# Patient Record
Sex: Male | Born: 1960 | Race: White | Hispanic: No | Marital: Married | Smoking: Current every day smoker
Health system: Southern US, Community
[De-identification: ages and names within clinical notes are randomized; demographics above are authoritative.]

## PROBLEM LIST (undated history)

## (undated) DIAGNOSIS — G43909 Migraine, unspecified, not intractable, without status migrainosus: Secondary | ICD-10-CM

## (undated) DIAGNOSIS — N200 Calculus of kidney: Secondary | ICD-10-CM

---

## 2015-01-22 DIAGNOSIS — N132 Hydronephrosis with renal and ureteral calculous obstruction: Secondary | ICD-10-CM | POA: Diagnosis present

## 2017-11-24 ENCOUNTER — Encounter: Payer: Self-pay | Admitting: Emergency Medicine

## 2017-11-24 ENCOUNTER — Emergency Department: Payer: PRIVATE HEALTH INSURANCE

## 2017-11-24 ENCOUNTER — Emergency Department
Admission: EM | Admit: 2017-11-24 | Discharge: 2017-11-24 | Disposition: A | Discharge status: Home / Self Care | Payer: PRIVATE HEALTH INSURANCE | Attending: Emergency Medicine | Admitting: Emergency Medicine

## 2017-11-24 DIAGNOSIS — R1084 Generalized abdominal pain: Secondary | ICD-10-CM | POA: Diagnosis present

## 2017-11-24 DIAGNOSIS — N200 Calculus of kidney: Secondary | ICD-10-CM | POA: Diagnosis not present

## 2017-11-24 DIAGNOSIS — N179 Acute kidney failure, unspecified: Secondary | ICD-10-CM | POA: Diagnosis not present

## 2017-11-24 HISTORY — DX: Calculus of kidney: N20.0

## 2017-11-24 LAB — URINALYSIS, COMPLETE (UACMP) WITH MICROSCOPIC
Bacteria, UA: NONE SEEN
Bilirubin Urine: NEGATIVE
Glucose, UA: NEGATIVE mg/dL
Ketones, ur: NEGATIVE mg/dL
Leukocytes, UA: NEGATIVE
Nitrite: NEGATIVE
Protein, ur: NEGATIVE mg/dL
Specific Gravity, Urine: >1.03 — ABNORMAL HIGH (ref 1.005–1.030)
pH: 5.5 (ref 5.0–8.0)

## 2017-11-24 LAB — BASIC METABOLIC PANEL
Anion gap: 6 (ref 5–15)
BUN: 26 mg/dL — ABNORMAL HIGH (ref 6–20)
CO2: 28 mmol/L (ref 22–32)
Calcium: 9.1 mg/dL (ref 8.9–10.3)
Chloride: 106 mmol/L (ref 98–111)
Creatinine, Ser: 2.34 mg/dL — ABNORMAL HIGH (ref 0.61–1.24)
GFR calc Af Amer: 35 mL/min — ABNORMAL LOW (ref >60)
GFR calc non Af Amer: 30 mL/min — ABNORMAL LOW (ref >60)
Glucose, Bld: 124 mg/dL — ABNORMAL HIGH (ref 70–99)
Potassium: 4.2 mmol/L (ref 3.5–5.1)
Sodium: 140 mmol/L (ref 135–145)

## 2017-11-24 LAB — CBC
HCT: 42.9 % (ref 40.0–52.0)
Hemoglobin: 14.6 g/dL (ref 13.0–18.0)
MCH: 31.7 pg (ref 26.0–34.0)
MCHC: 34 g/dL (ref 32.0–36.0)
MCV: 93.5 fL (ref 80.0–100.0)
Platelets: 276 10*3/uL (ref 150–440)
RBC: 4.59 MIL/uL (ref 4.40–5.90)
RDW: 13.6 % (ref 11.5–14.5)
WBC: 12.3 10*3/uL — ABNORMAL HIGH (ref 3.8–10.6)

## 2017-11-24 MED ORDER — HYDROMORPHONE HCL 1 MG/ML IJ SOLN
1.0000 mg | Freq: Once | INTRAMUSCULAR | Status: AC
  Administered 2017-11-24: 1 mg via INTRAVENOUS
  Filled 2017-11-24: qty 1

## 2017-11-24 MED ORDER — TAMSULOSIN HCL 0.4 MG PO CAPS: 0.4000 mg | ORAL_CAPSULE | Freq: Every day | ORAL | 0 refills | Status: DC

## 2017-11-24 MED ORDER — SODIUM CHLORIDE 0.9 % IV BOLUS
1000.0000 mL | Freq: Once | INTRAVENOUS | Status: AC
  Administered 2017-11-24: 1000 mL via INTRAVENOUS

## 2017-11-24 MED ORDER — ONDANSETRON HCL 4 MG PO TABS: 4.0000 mg | ORAL_TABLET | Freq: Every day | ORAL | 0 refills | Status: DC | PRN

## 2017-11-24 MED ORDER — OXYCODONE-ACETAMINOPHEN 5-325 MG PO TABS: 1.0000 | ORAL_TABLET | ORAL | 0 refills | Status: AC | PRN

## 2017-11-24 MED ORDER — ONDANSETRON HCL 4 MG/2ML IJ SOLN
4.0000 mg | Freq: Once | INTRAMUSCULAR | Status: AC
  Administered 2017-11-24: 4 mg via INTRAVENOUS
  Filled 2017-11-24: qty 2

## 2017-11-24 NOTE — Consult Note (Signed)
   Urology Consult  I have been asked to see the patient by Dr. Schaevitz, for evaluation and management of left ureteral stone.  Chief Complaint: Left flank pain  History of Present Illness: Sean Rice is a 56 y.o. year old from [place] with a known 5 mm left UVJ stone diagnosed 2 days ago in [place].  He presented to our emergency room after traveling to [place] yesterday.    He has persistent left flank pain which is worsened.  He was told that he would pass a stone pass spontaneously given the size and location.  He was prescribed Flomax and pain meds.  He has received Dilaudid in the emergency room is now quite comfortable.  No nausea or vomiting.  No fevers or chills.  No significant urinary symptoms.  Notably today, his creatinine is up somewhat from his baseline of around 1.6.  He is on multiple previous stone episodes in the past requiring ureteroscopy and stents.  He is never had shockwave lithotripsy before but it may be interested in this.  He lives in [place] but works here in [place] on occasion.  He is in the area for business and will be for about 8 days.  KUB in the emergency room shows multiple phleboliths and unable to differentiate which is the true stone.  Thus CT scan was ordered which shows persistent 5 mm left UVJ stone with mild hydroureteronephrosis.  Past Medical History:  Diagnosis Date  . Kidney stones     Surgical history  multiple previous endoscopic stone procedures Spine surgery (cervical spine)  Home Medications:  sumatriptan (IMITREX) 25 MG TABS Take 25 mg by mouth every 2 (two) hours as needed. oxycodone-acetaminophen (PERCOCET) 5-325 MG TABS Take 1 tablet by mouth every 4 (four) hours as needed for Moderate Pain (4-6).  Flomax 0.4 mg daily  Allergies: No known drug allergies  Family history: Not pertinent  Social History:  has no tobacco, alcohol, and drug history on file.  ROS: A complete review of systems was performed.  All  systems are negative except for pertinent findings as noted.  Physical Exam:  Vital signs in last 24 hours: Temp:  [98.6 F (37 C)] 98.6 F (37 C) (07/17 1238) Pulse Rate:  [64-82] 65 (07/17 1632) Resp:  [20] 20 (07/17 1238) BP: (111-132)/(69-83) 111/79 (07/17 1632) SpO2:  [92 %-97 %] 92 % (07/17 1632) Weight:  [290 lb (131.5 kg)] 290 lb (131.5 kg) (07/17 1239) Constitutional:  Alert and oriented, No acute distress HEENT: [place] AT, moist mucus membranes.  Trachea midline, no masses Cardiovascular: Regular rate and rhythm, no clubbing, cyanosis, or edema. Respiratory: Normal respiratory effort, lungs clear bilaterally GI: Abdomen is soft, minimal left lower quadrant pain GU: No CVA tenderness bilaterally Skin: No rashes, bruises or suspicious lesions Neurologic: Grossly intact, no focal deficits, moving all 4 extremities Psychiatric: Normal mood and affect   Laboratory Data:  Recent Labs    11/24/17 1240  WBC 12.3*  HGB 14.6  HCT 42.9   Recent Labs    11/24/17 1240  NA 140  K 4.2  CL 106  CO2 28  GLUCOSE 124*  BUN 26*  CREATININE 2.34*  CALCIUM 9.1   Component     Latest Ref Rng & Units 11/24/2017  Color, Urine     YELLOW YELLOW  Appearance     CLEAR CLEAR  Specific Gravity, Urine     1.005 - 1.030 >1.030 (H)  pH     5.0 - 8.0   5.5  Glucose     NEGATIVE mg/dL NEGATIVE  Hgb urine dipstick     NEGATIVE SMALL (A)  Bilirubin Urine     NEGATIVE NEGATIVE  Ketones, ur     NEGATIVE mg/dL NEGATIVE  Protein     NEGATIVE mg/dL NEGATIVE  Nitrite     NEGATIVE NEGATIVE  Leukocytes, UA     NEGATIVE NEGATIVE  Squamous Epithelial / LPF     0 - 5 0-5  WBC, UA     0 - 5 WBC/hpf 0-5  RBC / HPF     0 - 5 RBC/hpf 0-5  Bacteria, UA     NONE SEEN NONE SEEN    Radiologic Imaging: CLINICAL DATA:  Left flank pain for 2 days.  EXAM: CT ABDOMEN AND PELVIS WITHOUT CONTRAST  TECHNIQUE: Multidetector CT imaging of the abdomen and pelvis was performed following the  standard protocol without IV contrast.  COMPARISON:  Radiograph 11/24/2017  FINDINGS: Lower chest: The lung bases are clear of acute process. No pleural effusion or pulmonary lesions. The heart is normal in size. No pericardial effusion. The distal esophagus and aorta are unremarkable.  Hepatobiliary: No focal hepatic lesions or intrahepatic biliary dilatation. The gallbladder is normal. No common bile duct dilatation.  Pancreas: No mass, inflammation or ductal dilatation.  Spleen: Normal size.  No focal lesions.  Adrenals/Urinary Tract: The adrenal glands are normal.  Right kidney:  No renal or ureteral calculi. No worrisome renal lesions without contrast.  Left kidney:  Moderate left-sided hydroureteronephrosis down to an obstructing 5 x 4 mm calculus at the left UVJ. No worrisome renal lesions.  The bladder is unremarkable.  Stomach/Bowel: The stomach, duodenum, small bowel and colon are grossly normal without oral contrast. No inflammatory changes, mass lesions or obstructive findings. The terminal ileum and appendix are normal.  Vascular/Lymphatic: The aorta is normal in caliber. No dissection. The branch vessels are patent. The major venous structures are patent. No mesenteric or retroperitoneal mass or adenopathy. Small scattered lymph nodes are noted.  Reproductive: The prostate gland and seminal vesicles are unremarkable.  Other: No pelvic mass or adenopathy. No free pelvic fluid collections. No inguinal mass or adenopathy. No abdominal wall hernia or subcutaneous lesions.  Musculoskeletal: No significant bony findings.  IMPRESSION: 5 x 4 mm left UVJ calculus causing moderate grade obstruction with moderate left-sided hydroureteronephrosis.  Small lower pole left renal calculus.  No worrisome renal or bladder lesions.  No other significant intra-abdominal/pelvic findings.   Electronically Signed   By: P.  Gallerani M.D.    On: 11/24/2017 16:40  CLINICAL DATA:  Pt reports dx'd with a 5mm stone to the left side a few days ago. Pt states that he was told to come to the ED in 2 days if it had not passed. Pt reports still with pain to his left flank.  EXAM: ABDOMEN - 1 VIEW  COMPARISON:  None.  FINDINGS: Multiple calcifications along the anatomic pelvis, most of which are likely vascular. Notable bowel projects over both renal shadows. I do not see a definite stone separate from the bowel shadows, but sensitivity is low given the complexity of the bowel shadows projecting over the kidneys.  Linear calcification just to the left of the sacrum measures 14 mm in length and is probably vascular, but could be a distal ureteral calculus.  IMPRESSION: 1. Calcifications in the pelvis bilaterally are probably vascular. Some of these in the upper pelvis could potentially be ureteral calculi on both sides. CT would   be ideal for differentiation.   Electronically Signed   By: Walter  Liebkemann M.D.   On: 11/24/2017 16:18    Impression/ Plan: 56-year-old male with history of recurrent kidney stones with a 5 mm left UVJ stone without signs or symptoms of infection, however, his pain is been somewhat poorly controlled.  He is interested in proceeding with intervention due to this.  He is not currently n.p.o.   We repeated the CT scan here as KUB was not definitive for retained stone.  This does indeed confirm that the stone is still present.  With comparison to KUB, the stone is likely obscured by the sacrum thus not visible.  Each of these studies were personally reviewed.    We discussed possible treatment options which would include ureteroscopy with laser lithotripsy and stent versus shockwave lithotripsy.  We discussed that shockwave lithotripsy is less effective, especially with distal ureteral stones and given that the stone is not visible, we may not be able to see it down on the truck thus may not  be able to proceed with this.  We discussed the risk and benefits of each as well as the efficacy of each.    He is adamant about not wanting a ureteral stent again and would like to try shockwave lithotripsy.  He understands all the above.  We will plan to bring him out to the truck and perform some oblique images to see if we can do stone.  If not, he will we will have this procedure and will need ureteroscopy.  He also has acute on chronic renal insufficiency secondary to ureteral obstruction.  No hyperkalemia.  Was also likely exacerbated due to dehydration.  At this point time, his pain is controlled and would like to go home tonight.  Strict return precautions were reviewed.  We will proceed as above tomorrow and if shockwave is not possible, will hopefully able to do ureteroscopy.  All questions answered.   11/24/2017, 4:43 PM  Sean Luffman,  MD   Case was discussed with Dr. Schaevitz who is agreeable this plan.     

## 2017-11-24 NOTE — ED Triage Notes (Signed)
Pt reports dx'd with a 5mm stone to the left side a few days ago. Pt states that he was told to come to the ED in 2 days if it had not passed. Pt reports still with pain to his left flank.

## 2017-11-24 NOTE — H&P (View-Only) (Signed)
Urology Consult  I have been asked to see the patient by Dr. Pershing ProudSchaevitz, for evaluation and management of left ureteral stone.  Chief Complaint: Left flank pain  History of Present Illness: Sean Rice is a 57 y.o. year old from [place] with a known 5 mm left UVJ stone diagnosed 2 days ago in [place].  He presented to our emergency room [place].    He has persistent left flank pain which is worsened.  He was told that he would pass a stone pass spontaneously given the size and location.  He was prescribed Flomax and pain meds.  He has received Dilaudid in the emergency room is now quite comfortable.  No nausea or vomiting.  No fevers or chills.  No significant urinary symptoms.  Notably today, his creatinine is up somewhat from his baseline of around 1.6.  He is on multiple previous stone episodes in the past requiring ureteroscopy and stents.  He is never had shockwave lithotripsy before but it may be interested in this.  [place] lives in [place].  He is in the area for business and will be for about 8 days.  KUB in the emergency room shows multiple phleboliths and unable to differentiate which is the true stone.  Thus CT scan was ordered which shows persistent 5 mm left UVJ stone with mild hydroureteronephrosis.  Past Medical History:  Diagnosis Date  . Kidney stones     Surgical history  multiple previous endoscopic stone procedures Spine surgery (cervical spine)  Home Medications:  sumatriptan (IMITREX) 25 MG TABS Take 25 mg by mouth every 2 (two) hours as needed. oxycodone-acetaminophen (PERCOCET) 5-325 MG TABS Take 1 tablet by mouth every 4 (four) hours as needed for Moderate Pain (4-6).  Flomax 0.4 mg daily  Allergies: No known drug allergies  Family history: Not pertinent  Social History:  has no tobacco, alcohol, and drug history on file.  ROS: A complete review of systems was performed.  All  systems are negative except for pertinent findings as noted.  Physical Exam:  Vital signs in last 24 hours: Temp:  [98.6 F (37 C)] 98.6 F (37 C) (07/17 1238) Pulse Rate:  [64-82] 65 (07/17 1632) Resp:  [20] 20 (07/17 1238) BP: (111-132)/(69-83) 111/79 (07/17 1632) SpO2:  [92 %-97 %] 92 % (07/17 1632) Weight:  [290 lb (131.5 kg)] 290 lb (131.5 kg) (07/17 1239) Constitutional:  Alert and oriented, No acute distress HEENT: [place] AT, moist mucus membranes.  Trachea midline, no masses Cardiovascular: Regular rate and rhythm, no clubbing, cyanosis, or edema. Respiratory: Normal respiratory effort, lungs clear bilaterally GI: Abdomen is soft, minimal left lower quadrant pain GU: No CVA tenderness bilaterally Skin: No rashes, bruises or suspicious lesions Neurologic: Grossly intact, no focal deficits, moving all 4 extremities Psychiatric: Normal mood and affect   Laboratory Data:  Recent Labs    11/24/17 1240  WBC 12.3*  HGB 14.6  HCT 42.9   Recent Labs    11/24/17 1240  NA 140  K 4.2  CL 106  CO2 28  GLUCOSE 124*  BUN 26*  CREATININE 2.34*  CALCIUM 9.1   Component     Latest Ref Rng & Units 11/24/2017  Color, Urine     YELLOW YELLOW  Appearance     CLEAR CLEAR  Specific Gravity, Urine     1.005 - 1.030 >1.030 (H)  pH     5.0 - 8.0  5.5  Glucose     NEGATIVE mg/dL NEGATIVE  Hgb urine dipstick     NEGATIVE SMALL (A)  Bilirubin Urine     NEGATIVE NEGATIVE  Ketones, ur     NEGATIVE mg/dL NEGATIVE  Protein     NEGATIVE mg/dL NEGATIVE  Nitrite     NEGATIVE NEGATIVE  Leukocytes, UA     NEGATIVE NEGATIVE  Squamous Epithelial / LPF     0 - 5 0-5  WBC, UA     0 - 5 WBC/hpf 0-5  RBC / HPF     0 - 5 RBC/hpf 0-5  Bacteria, UA     NONE SEEN NONE SEEN    Radiologic Imaging: CLINICAL DATA:  Left flank pain for 2 days.  EXAM: CT ABDOMEN AND PELVIS WITHOUT CONTRAST  TECHNIQUE: Multidetector CT imaging of the abdomen and pelvis was performed following the  standard protocol without IV contrast.  COMPARISON:  Radiograph 11/24/2017  FINDINGS: Lower chest: The lung bases are clear of acute process. No pleural effusion or pulmonary lesions. The heart is normal in size. No pericardial effusion. The distal esophagus and aorta are unremarkable.  Hepatobiliary: No focal hepatic lesions or intrahepatic biliary dilatation. The gallbladder is normal. No common bile duct dilatation.  Pancreas: No mass, inflammation or ductal dilatation.  Spleen: Normal size.  No focal lesions.  Adrenals/Urinary Tract: The adrenal glands are normal.  Right kidney:  No renal or ureteral calculi. No worrisome renal lesions without contrast.  Left kidney:  Moderate left-sided hydroureteronephrosis down to an obstructing 5 x 4 mm calculus at the left UVJ. No worrisome renal lesions.  The bladder is unremarkable.  Stomach/Bowel: The stomach, duodenum, small bowel and colon are grossly normal without oral contrast. No inflammatory changes, mass lesions or obstructive findings. The terminal ileum and appendix are normal.  Vascular/Lymphatic: The aorta is normal in caliber. No dissection. The branch vessels are patent. The major venous structures are patent. No mesenteric or retroperitoneal mass or adenopathy. Small scattered lymph nodes are noted.  Reproductive: The prostate gland and seminal vesicles are unremarkable.  Other: No pelvic mass or adenopathy. No free pelvic fluid collections. No inguinal mass or adenopathy. No abdominal wall hernia or subcutaneous lesions.  Musculoskeletal: No significant bony findings.  IMPRESSION: 5 x 4 mm left UVJ calculus causing moderate grade obstruction with moderate left-sided hydroureteronephrosis.  Small lower pole left renal calculus.  No worrisome renal or bladder lesions.  No other significant intra-abdominal/pelvic findings.   Electronically Signed   By: Rudie Meyer M.D.    On: 11/24/2017 16:40  CLINICAL DATA:  Pt reports dx'd with a 5mm stone to the left side a few days ago. Pt states that he was told to come to the ED in 2 days if it had not passed. Pt reports still with pain to his left flank.  EXAM: ABDOMEN - 1 VIEW  COMPARISON:  None.  FINDINGS: Multiple calcifications along the anatomic pelvis, most of which are likely vascular. Notable bowel projects over both renal shadows. I do not see a definite stone separate from the bowel shadows, but sensitivity is low given the complexity of the bowel shadows projecting over the kidneys.  Linear calcification just to the left of the sacrum measures 14 mm in length and is probably vascular, but could be a distal ureteral calculus.  IMPRESSION: 1. Calcifications in the pelvis bilaterally are probably vascular. Some of these in the upper pelvis could potentially be ureteral calculi on both sides. CT would  be ideal for differentiation.   Electronically Signed   By: Gaylyn Rong M.D.   On: 11/24/2017 16:18    Impression/ Plan: 57 year old male with history of recurrent kidney stones with a 5 mm left UVJ stone without signs or symptoms of infection, however, his pain is been somewhat poorly controlled.  He is interested in proceeding with intervention due to this.  He is not currently n.p.o.   We repeated the CT scan here as KUB was not definitive for retained stone.  This does indeed confirm that the stone is still present.  With comparison to KUB, the stone is likely obscured by the sacrum thus not visible.  Each of these studies were personally reviewed.    We discussed possible treatment options which would include ureteroscopy with laser lithotripsy and stent versus shockwave lithotripsy.  We discussed that shockwave lithotripsy is less effective, especially with distal ureteral stones and given that the stone is not visible, we may not be able to see it down on the truck thus may not  be able to proceed with this.  We discussed the risk and benefits of each as well as the efficacy of each.    He is adamant about not wanting a ureteral stent again and would like to try shockwave lithotripsy.  He understands all the above.  We will plan to bring him out to the truck and perform some oblique images to see if we can do stone.  If not, he will we will have this procedure and will need ureteroscopy.  He also has acute on chronic renal insufficiency secondary to ureteral obstruction.  No hyperkalemia.  Was also likely exacerbated due to dehydration.  At this point time, his pain is controlled and would like to go home tonight.  Strict return precautions were reviewed.  We will proceed as above tomorrow and if shockwave is not possible, will hopefully able to do ureteroscopy.  All questions answered.   11/24/2017, 4:43 PM  Vanna Scotland,  MD   Case was discussed with Dr. Pershing Proud who is agreeable this plan.

## 2017-11-24 NOTE — ED Provider Notes (Signed)
[facility] Emergency Department Provider Note ____________________________________________   First MD Initiated Contact with Patient 11/24/17 1459     (approximate)  I have reviewed the triage vital signs and the nursing notes.   HISTORY  Chief Complaint Flank Pain  HPI Sean Rice is a 57 y.o. male  history of kidney stones requiring "11 stents" who is presenting to the emergency department left-sided flank pain.  He says that he was diagnosed with a 5 mm UVJ stone on 15 July in [place] and discharged with Percocet.  However, he says that he has been using the Percocet as prescribed and the pain is been uncontrollable since midnight last night.  No fever, nausea or vomiting.  He states the pain is a 10 out of 10 right now and feels like a sharp stabbing pain to his left-sided lumbar back region.  Says that he is not having any issues urinating.  Past Medical History:  Diagnosis Date  . Kidney stones     There are no active problems to display for this patient.   History reviewed. No pertinent surgical history.  Prior to Admission medications   Not on File    Allergies Patient has no allergy information on record.  No family history on file.  Social History Social History   Tobacco Use  . Smoking status: Not on file  Substance Use Topics  . Alcohol use: Not on file  . Drug use: Not on file    Review of Systems  Constitutional: No fever/chills Eyes: No visual changes. ENT: No sore throat. Cardiovascular: Denies chest pain. Respiratory: Denies shortness of breath. Gastrointestinal: No abdominal pain.  No nausea, no vomiting.  No diarrhea.  No constipation. Genitourinary: Negative for dysuria. Musculoskeletal: As above  skin: Negative for rash. Neurological: Negative for headaches, focal weakness or numbness.   ____________________________________________   PHYSICAL EXAM:  VITAL SIGNS: ED Triage Vitals  Enc Vitals Group     BP  11/24/17 1238 132/71     Pulse Rate 11/24/17 1238 82     Resp 11/24/17 1238 20     Temp 11/24/17 1238 98.6 F (37 C)     Temp Source 11/24/17 1238 Oral     SpO2 11/24/17 1238 95 %     Weight 11/24/17 1226 290 lb (131.5 kg)     Height 11/24/17 1226 6\' 2"  (1.88 m)     Head Circumference --      Peak Flow --      Pain Score 11/24/17 1226 10     Pain Loc --      Pain Edu? --      Excl. in GC? --     Constitutional: Alert and oriented.  Appears uncomfortable.  Pacing around the room. Eyes: Conjunctivae are normal.  Head: Atraumatic. Nose: No congestion/rhinnorhea. Mouth/Throat: Mucous membranes are moist.  Neck: No stridor.   Cardiovascular: Normal rate, regular rhythm. Grossly normal heart sounds.   Respiratory: Normal respiratory effort.  No retractions. Lungs CTAB. Gastrointestinal: Soft and nontender. No distention.  Mild left-sided CVA tenderness to palpation. Musculoskeletal: No lower extremity tenderness nor edema.  No joint effusions. Neurologic:  Normal speech and language. No gross focal neurologic deficits are appreciated. Skin:  Skin is warm, dry and intact. No rash noted. Psychiatric: Mood and affect are normal. Speech and behavior are normal.  ____________________________________________   LABS (all labs ordered are listed, but only abnormal results are displayed)  Labs Reviewed  URINALYSIS, COMPLETE (UACMP) WITH MICROSCOPIC -  Abnormal; Notable for the following components:      Result Value   Specific Gravity, Urine >1.030 (*)    Hgb urine dipstick SMALL (*)    All other components within normal limits  BASIC METABOLIC PANEL - Abnormal; Notable for the following components:   Glucose, Bld 124 (*)    BUN 26 (*)    Creatinine, Ser 2.34 (*)    GFR calc non Af Amer 30 (*)    GFR calc Af Amer 35 (*)    All other components within normal limits  CBC - Abnormal; Notable for the following components:   WBC 12.3 (*)    All other components within normal limits    ____________________________________________  EKG   ____________________________________________  RADIOLOGY  Reviewed imaging from 715 from care everywhere which shows that the patient did indeed have a 5 mm left-sided UVJ stone. ____________________________________________   PROCEDURES  Procedure(s) performed:   Procedures  Critical Care performed:   ____________________________________________   INITIAL IMPRESSION / ASSESSMENT AND PLAN / ED COURSE  Pertinent labs & imaging results that were available during my care of the patient were reviewed by me and considered in my medical decision making (see chart for details).  Differential diagnosis includes, but is not limited to, acute appendicitis, renal colic, testicular torsion, urinary tract infection/pyelonephritis, prostatitis,  epididymitis, diverticulitis, small bowel obstruction or ileus, colitis, abdominal aortic aneurysm, gastroenteritis, hernia, etc. As part of my medical decision making, I reviewed the following data within the electronic MEDICAL RECORD NUMBER Notes from prior ED visits  ----------------------------------------- 3:41 PM on 11/24/2017 -----------------------------------------  Pain controlled with Dilaudid and is now a 4 out of 10.  Patient is lying in the bed and appears comfortable.  Discussed case with Dr. Apolinar Junes of urology who is going to discuss further treatment plan for the patient as the patient says that he is only in town for 8 days.  KUB ordered by Dr. Apolinar Junes.  ----------------------------------------- 5:04 PM on 11/24/2017 -----------------------------------------  Patient at this time is pain-free.  Evaluated by Dr. Apolinar Junes who will be seeing the patient tomorrow morning 815 in the medical mall, same-day surgery, for likely lithotripsy.  Patient out of Percocet.  Will be discharged with Percocet and Flomax.  He is understanding the plan for follow-up tomorrow and willing to comply.   Repeat CT ordered with repeat demonstration of kidney stone with hydroureteronephrosis.  Dr. Apolinar Junes aware of the renal failure and has established this plan for the overall treatment of the patient's medical issues at this time.  ____________________________________________   FINAL CLINICAL IMPRESSION(S) / ED DIAGNOSES  Acute kidney injury.  Kidney stone.    NEW MEDICATIONS STARTED DURING THIS VISIT:  New Prescriptions   No medications on file     Note:  This document was prepared using Dragon voice recognition software and may include unintentional dictation errors.     Myrna Blazer, MD 11/24/17 [id number]

## 2017-11-24 NOTE — ED Notes (Signed)
ED Provider at bedside. 

## 2017-11-24 NOTE — ED Notes (Signed)
Patient transported to CT 

## 2017-11-25 ENCOUNTER — Encounter
Admission: RE | Disposition: A | Discharge status: Home / Self Care | Payer: Self-pay | Source: Ambulatory Visit | Attending: Urology

## 2017-11-25 ENCOUNTER — Ambulatory Visit: Payer: PRIVATE HEALTH INSURANCE | Admitting: Anesthesiology

## 2017-11-25 ENCOUNTER — Other Ambulatory Visit: Payer: Self-pay

## 2017-11-25 ENCOUNTER — Ambulatory Visit
Admission: RE | Admit: 2017-11-25 | Discharge: 2017-11-25 | Disposition: A | Discharge status: Home / Self Care | Payer: PRIVATE HEALTH INSURANCE | Source: Ambulatory Visit | Attending: Urology | Admitting: Urology

## 2017-11-25 ENCOUNTER — Ambulatory Visit: Admit: 2017-11-25 | Payer: PRIVATE HEALTH INSURANCE | Admitting: Urology

## 2017-11-25 ENCOUNTER — Other Ambulatory Visit: Payer: Self-pay | Admitting: Radiology

## 2017-11-25 DIAGNOSIS — Z79899 Other long term (current) drug therapy: Secondary | ICD-10-CM | POA: Diagnosis not present

## 2017-11-25 DIAGNOSIS — N201 Calculus of ureter: Secondary | ICD-10-CM

## 2017-11-25 DIAGNOSIS — N132 Hydronephrosis with renal and ureteral calculous obstruction: Secondary | ICD-10-CM | POA: Diagnosis not present

## 2017-11-25 DIAGNOSIS — F17200 Nicotine dependence, unspecified, uncomplicated: Secondary | ICD-10-CM | POA: Insufficient documentation

## 2017-11-25 DIAGNOSIS — N189 Chronic kidney disease, unspecified: Secondary | ICD-10-CM | POA: Insufficient documentation

## 2017-11-25 DIAGNOSIS — Z87442 Personal history of urinary calculi: Secondary | ICD-10-CM | POA: Diagnosis not present

## 2017-11-25 HISTORY — PX: EXTRACORPOREAL SHOCK WAVE LITHOTRIPSY: SHX1557

## 2017-11-25 HISTORY — PX: CYSTOSCOPY/URETEROSCOPY/HOLMIUM LASER/STENT PLACEMENT: SHX6546

## 2017-11-25 SURGERY — LITHOTRIPSY, ESWL
Anesthesia: Moderate Sedation | Laterality: Left

## 2017-11-25 SURGERY — CYSTOSCOPY/URETEROSCOPY/HOLMIUM LASER/STENT PLACEMENT
Anesthesia: General | Laterality: Left

## 2017-11-25 MED ORDER — ROCURONIUM BROMIDE 50 MG/5ML IV SOLN
INTRAVENOUS | Status: AC
  Filled 2017-11-25: qty 1

## 2017-11-25 MED ORDER — CIPROFLOXACIN HCL 500 MG PO TABS
500.0000 mg | ORAL_TABLET | ORAL | Status: AC
  Administered 2017-11-25: 500 mg via ORAL

## 2017-11-25 MED ORDER — PROMETHAZINE HCL 25 MG/ML IJ SOLN: 6.2500 mg | INTRAMUSCULAR | Status: DC | PRN

## 2017-11-25 MED ORDER — ONDANSETRON HCL 4 MG/2ML IJ SOLN
INTRAMUSCULAR | Status: AC
  Filled 2017-11-25: qty 2

## 2017-11-25 MED ORDER — DIAZEPAM 5 MG PO TABS
ORAL_TABLET | ORAL | Status: AC
  Filled 2017-11-25: qty 2

## 2017-11-25 MED ORDER — HYDROCODONE-ACETAMINOPHEN 7.5-325 MG PO TABS
1.0000 | ORAL_TABLET | Freq: Once | ORAL | Status: DC | PRN
Start: 2017-11-25 — End: 2017-11-25

## 2017-11-25 MED ORDER — PROPOFOL 10 MG/ML IV BOLUS
INTRAVENOUS | Status: DC | PRN
  Administered 2017-11-25: 200 mg via INTRAVENOUS

## 2017-11-25 MED ORDER — SUCCINYLCHOLINE CHLORIDE 20 MG/ML IJ SOLN
INTRAMUSCULAR | Status: DC | PRN
  Administered 2017-11-25: 120 mg via INTRAVENOUS

## 2017-11-25 MED ORDER — DEXAMETHASONE SODIUM PHOSPHATE 10 MG/ML IJ SOLN
INTRAMUSCULAR | Status: DC | PRN
  Administered 2017-11-25: 10 mg via INTRAVENOUS

## 2017-11-25 MED ORDER — FENTANYL CITRATE (PF) 100 MCG/2ML IJ SOLN
INTRAMUSCULAR | Status: DC | PRN
  Administered 2017-11-25: 50 ug via INTRAVENOUS

## 2017-11-25 MED ORDER — LIDOCAINE HCL (CARDIAC) PF 100 MG/5ML IV SOSY
PREFILLED_SYRINGE | INTRAVENOUS | Status: DC | PRN
  Administered 2017-11-25: 100 mg via INTRAVENOUS

## 2017-11-25 MED ORDER — CEFAZOLIN SODIUM-DEXTROSE 2-3 GM-%(50ML) IV SOLR
INTRAVENOUS | Status: DC | PRN
  Administered 2017-11-25: 2 g via INTRAVENOUS

## 2017-11-25 MED ORDER — HYDROMORPHONE HCL 1 MG/ML IJ SOLN: 0.2500 mg | INTRAMUSCULAR | Status: DC | PRN

## 2017-11-25 MED ORDER — MIDAZOLAM HCL 2 MG/2ML IJ SOLN
INTRAMUSCULAR | Status: AC
  Filled 2017-11-25: qty 2

## 2017-11-25 MED ORDER — CEFAZOLIN SODIUM 1 G IJ SOLR
INTRAMUSCULAR | Status: AC
  Filled 2017-11-25: qty 20

## 2017-11-25 MED ORDER — SODIUM CHLORIDE 0.9 % IV SOLN
INTRAVENOUS | Status: DC
  Administered 2017-11-25 (×2): via INTRAVENOUS

## 2017-11-25 MED ORDER — PROPOFOL 10 MG/ML IV BOLUS
INTRAVENOUS | Status: AC
  Filled 2017-11-25: qty 20

## 2017-11-25 MED ORDER — DEXAMETHASONE SODIUM PHOSPHATE 10 MG/ML IJ SOLN
INTRAMUSCULAR | Status: AC
  Filled 2017-11-25: qty 1

## 2017-11-25 MED ORDER — ONDANSETRON HCL 4 MG/2ML IJ SOLN
INTRAMUSCULAR | Status: DC | PRN
  Administered 2017-11-25: 4 mg via INTRAVENOUS

## 2017-11-25 MED ORDER — ONDANSETRON HCL 4 MG/2ML IJ SOLN
4.0000 mg | Freq: Once | INTRAMUSCULAR | Status: AC | PRN
  Administered 2017-11-25: 4 mg via INTRAVENOUS

## 2017-11-25 MED ORDER — LIDOCAINE HCL (PF) 2 % IJ SOLN
INTRAMUSCULAR | Status: AC
  Filled 2017-11-25: qty 10

## 2017-11-25 MED ORDER — FENTANYL CITRATE (PF) 100 MCG/2ML IJ SOLN
INTRAMUSCULAR | Status: AC
  Filled 2017-11-25: qty 2

## 2017-11-25 MED ORDER — ACETAMINOPHEN 325 MG PO TABS: 325.0000 mg | ORAL_TABLET | ORAL | Status: DC | PRN

## 2017-11-25 MED ORDER — SODIUM CHLORIDE 0.9 % IJ SOLN
INTRAMUSCULAR | Status: AC
  Filled 2017-11-25: qty 20

## 2017-11-25 MED ORDER — MEPERIDINE HCL 50 MG/ML IJ SOLN: 6.2500 mg | INTRAMUSCULAR | Status: DC | PRN

## 2017-11-25 MED ORDER — ACETAMINOPHEN 160 MG/5ML PO SOLN
325.0000 mg | ORAL | Status: DC | PRN
  Filled 2017-11-25: qty 20.3

## 2017-11-25 MED ORDER — DIPHENHYDRAMINE HCL 25 MG PO CAPS
25.0000 mg | ORAL_CAPSULE | ORAL | Status: AC
  Administered 2017-11-25: 25 mg via ORAL

## 2017-11-25 MED ORDER — DIPHENHYDRAMINE HCL 25 MG PO CAPS
ORAL_CAPSULE | ORAL | Status: AC
  Filled 2017-11-25: qty 1

## 2017-11-25 MED ORDER — DIAZEPAM 5 MG PO TABS
10.0000 mg | ORAL_TABLET | ORAL | Status: AC
  Administered 2017-11-25: 10 mg via ORAL

## 2017-11-25 MED ORDER — PHENYLEPHRINE HCL 10 MG/ML IJ SOLN
INTRAMUSCULAR | Status: DC | PRN
  Administered 2017-11-25 (×3): 200 ug via INTRAVENOUS

## 2017-11-25 MED ORDER — CIPROFLOXACIN HCL 500 MG PO TABS
ORAL_TABLET | ORAL | Status: AC
  Administered 2017-11-25: 500 mg via ORAL
  Filled 2017-11-25: qty 1

## 2017-11-25 SURGICAL SUPPLY — 26 items
BAG DRAIN CYSTO-URO LG1000N (MISCELLANEOUS) ×2 IMPLANT
BASKET ZERO TIP 1.9FR (BASKET) ×2 IMPLANT
CATH URETL 5X70 OPEN END (CATHETERS) ×2 IMPLANT
CNTNR SPEC 2.5X3XGRAD LEK (MISCELLANEOUS)
CONRAY 43 FOR UROLOGY 50M (MISCELLANEOUS) ×2 IMPLANT
CONT SPEC 4OZ STER OR WHT (MISCELLANEOUS)
CONTAINER SPEC 2.5X3XGRAD LEK (MISCELLANEOUS) IMPLANT
DRAPE UTILITY 15X26 TOWEL STRL (DRAPES) ×2 IMPLANT
FIBER LASER LITHO 273 (Laser) ×2 IMPLANT
GLOVE BIO SURGEON STRL SZ 6.5 (GLOVE) ×2 IMPLANT
GOWN STRL REUS W/ TWL LRG LVL3 (GOWN DISPOSABLE) ×2 IMPLANT
GOWN STRL REUS W/TWL LRG LVL3 (GOWN DISPOSABLE) ×2
GUIDEWIRE GREEN .038 145CM (MISCELLANEOUS) IMPLANT
INFUSOR MANOMETER BAG 3000ML (MISCELLANEOUS) ×2 IMPLANT
INTRODUCER DILATOR DOUBLE (INTRODUCER) IMPLANT
KIT TURNOVER CYSTO (KITS) ×2 IMPLANT
PACK CYSTO AR (MISCELLANEOUS) ×2 IMPLANT
SENSORWIRE 0.038 NOT ANGLED (WIRE) ×2
SET CYSTO W/LG BORE CLAMP LF (SET/KITS/TRAYS/PACK) ×2 IMPLANT
SHEATH URETERAL 12FRX35CM (MISCELLANEOUS) IMPLANT
SOL .9 NS 3000ML IRR  AL (IV SOLUTION) ×1
SOL .9 NS 3000ML IRR UROMATIC (IV SOLUTION) ×1 IMPLANT
STENT URET 6FRX24 CONTOUR (STENTS) ×2 IMPLANT
SURGILUBE 2OZ TUBE FLIPTOP (MISCELLANEOUS) ×2 IMPLANT
WATER STERILE IRR 1000ML POUR (IV SOLUTION) ×2 IMPLANT
WIRE SENSOR 0.038 NOT ANGLED (WIRE) ×1 IMPLANT

## 2017-11-25 NOTE — Interval H&P Note (Signed)
History and Physical Interval Note:  11/25/2017 3:09 PM  Sean MouseJohn Rice  has presented today for surgery, with the diagnosis of recurrent kidney stones with a 5 mm left UVJ stone   The various methods of treatment have been discussed with the patient and family. After consideration of risks, benefits and other options for treatment, the patient has consented to  Procedure(s): CYSTOSCOPY/URETEROSCOPY/HOLMIUM LASER/STENT PLACEMENT (Left) as a surgical intervention .  The patient's history has been reviewed, patient examined, no change in status, stable for surgery.  I have reviewed the patient's chart and labs.  Questions were answered to the patient's satisfaction.     Vanna ScotlandAshley Merrilyn Legler

## 2017-11-25 NOTE — Anesthesia Post-op Follow-up Note (Signed)
Anesthesia QCDR form completed.        

## 2017-11-25 NOTE — Op Note (Signed)
Date of procedure: 11/25/17  Preoperative diagnosis:  1. Left distal ureteral calculus 2. Left flank pain 3. Left hydronephrosis  Postoperative diagnosis:  1. Same as above  Procedure: 1. Left ureteroscopy 2. Laser lithotripsy 3. Left ureteral stent placement 4. Basket extraction of stone fragment  Surgeon: Vanna ScotlandAshley Lener Ventresca, MD  Anesthesia: General  Complications: None  Intraoperative findings: Stone encountered within the distal ureter, fragmented and all debris removed using basket.  Stent on tether placed.  EBL: Minimal   Specimens: Stone fragment  Drains: 6 x 24 French double-J ureteral stent on left with tether left in place  Indication: Sean Rice is a 57 y.o. patient with 5 mm left UVJ stone refractory pain.  He was taken downstairs to the lithotripsy truck earlier today, however, the stone was unable to be identified under fluoroscopy.  As such, I discussed proceeding with ureteroscopy in the operating room if shockwave is not possible..  After reviewing the management options for treatment, he elected to proceed with the above surgical procedure(s). We have discussed the potential benefits and risks of the procedure, side effects of the proposed treatment, the likelihood of the patient achieving the goals of the procedure, and any potential problems that might occur during the procedure or recuperation. Informed consent has been obtained.  Description of procedure:  The patient was taken to the operating room and general anesthesia was induced.  The patient was placed in the dorsal lithotomy position, prepped and draped in the usual sterile fashion, and preoperative antibiotics were administered. A preoperative time-out was performed.   A 21 French scope was advanced per urethra to the bladder.  Attention was turned to the left ureteral orifice which had a somewhat mounded up appearance within the intramural ureter suspicious for retained stone just beyond the UO.  The  right UO did not have this appearance.  The remainder of the bladder was unremarkable.  A sensor wire was then placed up to level the kidney under fluoroscopic guidance which traversed by the stone quite easily.  A 4.5 semirigid ureteroscope was then advanced just within the UO and the stone was encountered.  It was too large for basket extraction.  As such, 273 m laser fiber using the settings of 0.8 J and 10 Hz was used to fragment the stone into proximal he 5 or 6 smaller pieces.  Each of these pieces was then extracted using 1.9 JamaicaFrench tipless nitinol basket.  The scope was then advanced all the way up to level of the iliacs and no additional stone pressure or stone fragments were identified.  No ureteral injuries were appreciated.  Finally, the safety wire was backloaded over rigid cystoscope.  A 6 x 24 French double-J ureteral stent was then advanced over the wire up to level of the kidney.  The wires partially drawn until full coils noted within the renal pelvis.  The wires and fully withdrawn a full coils noted within the bladder.  The bladder was then drained.  Of note, the string was left attached to the distal coil of the stent which was affixed to the glans using Mastisol and Tegaderm.  The patient was then clean and dry, repositioned supine position, reversed from anesthesia, taken to the PACU in stable condition.  Plan: Patient will remove his own stent on a tether on Monday.  He will follow-up with his urologist in [place].  I have recommended that he have a renal ultrasound in 1 month.  All questions answered.  Vanna ScotlandAshley Orvetta Danielski, M.D.

## 2017-11-25 NOTE — Interval H&P Note (Signed)
History and Physical Interval Note:  11/25/2017 3:08 PM  Sean MouseJohn Mifflin  has presented today for surgery, with the diagnosis of recurrent kidney stones with a 5 mm left UVJ stone   The various methods of treatment have been discussed with the patient and family. After consideration of risks, benefits and other options for treatment, the patient has consented to  Procedure(s): CYSTOSCOPY/URETEROSCOPY/HOLMIUM LASER/STENT PLACEMENT (Left) as a surgical intervention .  The patient's history has been reviewed, patient examined, no change in status, stable for surgery.  I have reviewed the patient's chart and labs.  Questions were answered to the patient's satisfaction.     Vanna ScotlandAshley Amparo Donalson

## 2017-11-25 NOTE — Transfer of Care (Signed)
Immediate Anesthesia Transfer of Care Note  Patient: Sean Rice  Procedure(s) Performed: CYSTOSCOPY/URETEROSCOPY/HOLMIUM LASER/STENT PLACEMENT (Left )  Patient Location: PACU  Anesthesia Type:General  Level of Consciousness: sedated  Airway & Oxygen Therapy: Patient Spontanous Breathing and Patient connected to face mask oxygen  Post-op Assessment: Report given to RN and Post -op Vital signs reviewed and stable  Post vital signs: Reviewed and stable  Last Vitals:  Vitals Value Taken Time  BP 139/83 11/25/2017 12:16 PM  Temp 36.3 C 11/25/2017 12:16 PM  Pulse 89 11/25/2017 12:16 PM  Resp 16 11/25/2017 12:16 PM  SpO2 100 % 11/25/2017 12:16 PM    Last Pain:  Vitals:   11/25/17 1216  TempSrc: Temporal  PainSc:          Complications: No apparent anesthesia complications

## 2017-11-25 NOTE — Anesthesia Preprocedure Evaluation (Signed)
Anesthesia Evaluation  Patient identified by MRN, date of birth, ID band Patient awake    Reviewed: Allergy & Precautions, H&P , NPO status , reviewed documented beta blocker date and time   Airway Mallampati: III  TM Distance: >3 FB Neck ROM: full    Dental  (+) Teeth Intact   Pulmonary Current Smoker,    Pulmonary exam normal        Cardiovascular Normal cardiovascular exam     Neuro/Psych    GI/Hepatic   Endo/Other    Renal/GU Renal disease     Musculoskeletal   Abdominal   Peds  Hematology   Anesthesia Other Findings Past Medical History: No date: Kidney stones History reviewed. No pertinent surgical history. BMI    Body Mass Index:  37.23 kg/m     Reproductive/Obstetrics                             Anesthesia Physical Anesthesia Plan  ASA: II  Anesthesia Plan: General ETT   Post-op Pain Management:    Induction: Intravenous  PONV Risk Score and Plan: 2 and Ondansetron, Treatment may vary due to age or medical condition, Midazolam, Dexamethasone and Metaclopromide  Airway Management Planned:   Additional Equipment:   Intra-op Plan:   Post-operative Plan: Extubation in OR  Informed Consent: I have reviewed the patients History and Physical, chart, labs and discussed the procedure including the risks, benefits and alternatives for the proposed anesthesia with the patient or authorized representative who has indicated his/her understanding and acceptance.   Dental Advisory Given  Plan Discussed with: CRNA  Anesthesia Plan Comments:         Anesthesia Quick Evaluation

## 2017-11-25 NOTE — Anesthesia Procedure Notes (Signed)
Procedure Name: Intubation Date/Time: 11/25/2017 11:21 AM Performed by: Johnna Acosta, CRNA Pre-anesthesia Checklist: Patient identified, Emergency Drugs available, Suction available, Patient being monitored and Timeout performed Patient Re-evaluated:Patient Re-evaluated prior to induction Oxygen Delivery Method: Circle system utilized Preoxygenation: Pre-oxygenation with 100% oxygen Induction Type: IV induction, Rapid sequence and Cricoid Pressure applied Ventilation: Mask ventilation without difficulty Laryngoscope Size: Mac and 3 Grade View: Grade I Tube type: Oral Tube size: 7.5 mm Number of attempts: 1 Airway Equipment and Method: Stylet Placement Confirmation: ETT inserted through vocal cords under direct vision,  positive ETCO2 and breath sounds checked- equal and bilateral Secured at: 21 cm Tube secured with: Tape Dental Injury: Teeth and Oropharynx as per pre-operative assessment

## 2017-11-25 NOTE — Discharge Instructions (Signed)
You have a ureteral stent in place.  This is a tube that extends from your kidney to your bladder.  This may cause urinary bleeding, burning with urination, and urinary frequency.  Please call our office or present to the ED if you develop fevers >101 or pain which is not able to be controlled with oral pain medications.  You may be given either Flomax and/ or ditropan to help with bladder spasms and stent pain in addition to pain medications.    Your stent is on a string taped to the head of your penis.  On Monday morning, you may untaped this and pulled gently.  Please avoid pulling it prematurely.  If you do, please call our office during office hours to let us know but it is not an emergency.  We generally recommend follow-up in 4 weeks after the stent is removed with a renal ultrasound.  Since you live in [place], I would like you to follow-up with your urologist in a month.  [facility] [address], [address] [place], [place] [address] [phone]  AMBULATORY SURGERY  DISCHARGE INSTRUCTIONS   1) The drugs that you were given will stay in your system until tomorrow so for the next 24 hours you should not:  A) Drive an automobile B) Make any legal decisions C) Drink any alcoholic beverage   2) You may resume regular meals tomorrow.  Today it is better to start with liquids and gradually work up to solid foods.  You may eat anything you prefer, but it is better to start with liquids, then soup and crackers, and gradually work up to solid foods.   3) Please notify your doctor immediately if you have any unusual bleeding, trouble breathing, redness and pain at the surgery site, drainage, fever, or pain not relieved by medication.    4) Additional Instructions:        Please contact your physician with any problems or Same Day Surgery at [phone], Monday through Friday 6 am to 4 pm, or [place] [facility] Main number at [phone].

## 2017-11-29 ENCOUNTER — Telehealth: Payer: Self-pay | Admitting: Radiology

## 2017-11-29 NOTE — Telephone Encounter (Signed)
Message was left to call patient. Attempted to return patient's call.

## 2017-11-29 NOTE — Telephone Encounter (Signed)
Returned patient's call. Asks how & when to remove stent. Per Dr Delana MeyerBrandon's operative note informed patient stent can be removed today & was instructed how to do this. Advised patient to follow up with his urologist in [place], where he resides, with a renal ultrasound in one month. Questions answered. Patient voices understanding.

## 2017-11-29 NOTE — Anesthesia Postprocedure Evaluation (Signed)
Anesthesia Post Note  Patient: Oleta MouseJohn Crunkleton  Procedure(s) Performed: CYSTOSCOPY/URETEROSCOPY/HOLMIUM LASER/STENT PLACEMENT (Left )  Patient location during evaluation: PACU Anesthesia Type: General Level of consciousness: awake and alert Pain management: pain level controlled Vital Signs Assessment: post-procedure vital signs reviewed and stable Respiratory status: spontaneous breathing, nonlabored ventilation and respiratory function stable Cardiovascular status: blood pressure returned to baseline and stable Postop Assessment: no apparent nausea or vomiting Anesthetic complications: no     Last Vitals:  Vitals:   11/25/17 1315 11/25/17 1341  BP: 106/66 128/72  Pulse: 72 69  Resp: 17 16  Temp:  36.9 C  SpO2: 91% 96%    Last Pain:  Vitals:   11/25/17 1341  TempSrc: Oral  PainSc: 0-No pain                 Christia ReadingScott T Ronalda Walpole

## 2017-12-01 LAB — STONE ANALYSIS
Stone Weight KSTONE: 18.4 mg
Uric Acid: 100 %

## 2017-12-02 ENCOUNTER — Telehealth: Payer: Self-pay | Admitting: Family Medicine

## 2017-12-02 NOTE — Telephone Encounter (Signed)
Patient notified and will talk to his Urologist.

## 2017-12-02 NOTE — Telephone Encounter (Signed)
-----   Message from Vanna ScotlandAshley Brandon, MD sent at 12/01/2017  1:17 PM EDT ----- Please call with patient with stone analysis results.  We can't see his stone on xray due to its components of uric acid!!!  Thanks why he was never offered shockwave in the past.  He probably needs to be on a stone prevention medication which he should discuss with his local urologist.    Vanna ScotlandAshley Brandon, MD

## 2017-12-09 ENCOUNTER — Ambulatory Visit: Payer: Self-pay | Admitting: Urology

## 2018-02-11 ENCOUNTER — Telehealth: Payer: Self-pay | Admitting: Family Medicine

## 2018-02-11 NOTE — Telephone Encounter (Signed)
Patient called stating his insurance company is kicking back the whole surgery amount because he was not in network. From what I see in the chart this was Emergent. They will deed a note from you stating he was not able to endure a 8 hour drive to get home to have the surgery. Is this something you can do?

## 2018-02-11 NOTE — Telephone Encounter (Signed)
Amy, please provide him a note stating that this was an emergent surgery for uncontrolled pain, obstructing stone, etc.  Vanna Scotland, MD

## 2018-02-14 ENCOUNTER — Encounter: Payer: Self-pay | Admitting: Radiology

## 2018-02-14 NOTE — Telephone Encounter (Signed)
Please send approved note and relevant office notes, imaging, etc.  To: Melissa R at [employer] (patient's insurance) Fax# [phone]

## 2018-02-15 NOTE — Telephone Encounter (Signed)
Done

## 2019-03-04 ENCOUNTER — Encounter (HOSPITAL_COMMUNITY): Payer: Self-pay

## 2019-03-04 ENCOUNTER — Other Ambulatory Visit: Payer: Self-pay

## 2019-03-04 ENCOUNTER — Emergency Department (HOSPITAL_COMMUNITY)
Admission: EM | Admit: 2019-03-04 | Discharge: 2019-03-04 | Disposition: A | Discharge status: Home / Self Care | Payer: PRIVATE HEALTH INSURANCE | Attending: Emergency Medicine | Admitting: Emergency Medicine

## 2019-03-04 DIAGNOSIS — Y999 Unspecified external cause status: Secondary | ICD-10-CM | POA: Diagnosis not present

## 2019-03-04 DIAGNOSIS — W2201XA Walked into wall, initial encounter: Secondary | ICD-10-CM | POA: Diagnosis not present

## 2019-03-04 DIAGNOSIS — S0990XA Unspecified injury of head, initial encounter: Secondary | ICD-10-CM

## 2019-03-04 DIAGNOSIS — Y92520 Airport as the place of occurrence of the external cause: Secondary | ICD-10-CM | POA: Insufficient documentation

## 2019-03-04 DIAGNOSIS — F17210 Nicotine dependence, cigarettes, uncomplicated: Secondary | ICD-10-CM | POA: Diagnosis not present

## 2019-03-04 DIAGNOSIS — Y939 Activity, unspecified: Secondary | ICD-10-CM | POA: Insufficient documentation

## 2019-03-04 MED ORDER — IBUPROFEN 800 MG PO TABS
800.0000 mg | ORAL_TABLET | Freq: Once | ORAL | Status: AC
  Administered 2019-03-04: 800 mg via ORAL
  Filled 2019-03-04: qty 1

## 2019-03-04 NOTE — ED Provider Notes (Signed)
[facility] EMERGENCY DEPARTMENT Provider Note   CSN: [id number] Arrival date & time: 03/04/19  1839     History   Chief Complaint Chief Complaint  Patient presents with  . Head Injury    HPI Sean Rice is a 58 y.o. male.     HPI Patient presents to the emergency department with head injury that [place] while at the airport.  The patient states he was walking and and there was a rollup door that was not up all the way and he hit the front of his head.  The patient states that he did not lose consciousness.  The patient states that he started feeling dizzy about an hour afterwards and EMS advised to come to the ER for recheck.  Patient states he is having no symptoms other than a little bit of throbbing in the front of his head.  Patient states that he had to take any medications prior to arrival for symptoms.  Patient denies headache, blurred vision, nausea, vomiting, weakness, neck pain, back pain or syncope. Past Medical History:  Diagnosis Date  . Kidney stones     There are no active problems to display for this patient.   Past Surgical History:  Procedure Laterality Date  . CYSTOSCOPY/URETEROSCOPY/HOLMIUM LASER/STENT PLACEMENT Left 11/25/2017   Procedure: CYSTOSCOPY/URETEROSCOPY/HOLMIUM LASER/STENT PLACEMENT;  Surgeon: Vanna Scotland, MD;  Location: [facility] ORS;  Service: Urology;  Laterality: Left;  . EXTRACORPOREAL SHOCK WAVE LITHOTRIPSY Left 11/25/2017   Procedure: EXTRACORPOREAL SHOCK WAVE LITHOTRIPSY (ESWL);  Surgeon: Vanna Scotland, MD;  Location: [facility] ORS;  Service: Urology;  Laterality: Left;        Home Medications    Prior to Admission medications   Medication Sig Start Date End Date Taking? Authorizing Provider  ondansetron (ZOFRAN) 4 MG tablet Take 1 tablet (4 mg total) by mouth daily as needed. 11/24/17   Schaevitz, Myra Rude, MD  SUMAtriptan (IMITREX) 50 MG tablet Take 50 mg by mouth every 2 (two) hours as needed for migraine (as  needed for migranes). May repeat in 2 hours if headache persists or recurs.    [provider]    Family History No family history on file.  Social History Social History   Tobacco Use  . Smoking status: Current Every Day Smoker    Packs/day: 0.25    Years: 0.50    Pack years: 0.12    Types: Cigarettes  Substance Use Topics  . Alcohol use: Never    Frequency: Never  . Drug use: Never     Allergies   Patient has no known allergies.   Review of Systems Review of Systems   Physical Exam Updated Vital Signs BP 137/79   Pulse (!) 55   Temp 98.2 F (36.8 C) (Oral)   Resp 16   Ht 6\' 2"  (1.88 m)   Wt 127.5 kg   SpO2 100%   BMI 36.08 kg/m   Physical Exam   ED Treatments / Results  Labs (all labs ordered are listed, but only abnormal results are displayed) Labs Reviewed - No data to display  EKG None  Radiology No results found.  Procedures Procedures (including critical care time)  Medications Ordered in ED Medications  ibuprofen (ADVIL) tablet 800 mg (800 mg Oral Given 03/04/19 1944)     Initial Impression / Assessment and Plan / ED Course  I have reviewed the triage vital signs and the nursing notes.  Pertinent labs & imaging results that were available during my care of  the patient were reviewed by me and considered in my medical decision making (see chart for details).        Patient will be treated for minor head injury.  The patient is having no symptoms at this time.  Patient is advised to return here for any worsening in his condition.  Also advised him to follow-up with his primary care doctor.  Final Clinical Impressions(s) / ED Diagnoses   Final diagnoses:  None    ED Discharge Orders    None       Rebeca Allegra 03/04/19 2004    Elnora Morrison, MD 03/05/19 [phone]

## 2019-03-04 NOTE — ED Triage Notes (Signed)
Pt hit his head on a metal door at the airport. No LOC but pt c.o feeling dizzy after accident .  Initial BP 200/110 BP now 140/80  Pt arrives, alert, oriented some dizziness with ambulation.

## 2019-03-04 NOTE — Discharge Instructions (Addendum)
Return here as needed.  Tylenol and Motrin for any pain.  Follow-up with your primary care doctor

## 2019-03-07 ENCOUNTER — Other Ambulatory Visit: Payer: Self-pay

## 2019-03-07 ENCOUNTER — Emergency Department
Admission: EM | Admit: 2019-03-07 | Discharge: 2019-03-07 | Disposition: A | Discharge status: Home / Self Care | Payer: PRIVATE HEALTH INSURANCE | Attending: Emergency Medicine | Admitting: Emergency Medicine

## 2019-03-07 ENCOUNTER — Emergency Department: Payer: PRIVATE HEALTH INSURANCE

## 2019-03-07 ENCOUNTER — Encounter: Payer: Self-pay | Admitting: Emergency Medicine

## 2019-03-07 DIAGNOSIS — F0781 Postconcussional syndrome: Secondary | ICD-10-CM | POA: Insufficient documentation

## 2019-03-07 DIAGNOSIS — F17210 Nicotine dependence, cigarettes, uncomplicated: Secondary | ICD-10-CM | POA: Insufficient documentation

## 2019-03-07 DIAGNOSIS — Y939 Activity, unspecified: Secondary | ICD-10-CM | POA: Diagnosis not present

## 2019-03-07 DIAGNOSIS — Y929 Unspecified place or not applicable: Secondary | ICD-10-CM | POA: Insufficient documentation

## 2019-03-07 DIAGNOSIS — R519 Headache, unspecified: Secondary | ICD-10-CM | POA: Diagnosis not present

## 2019-03-07 DIAGNOSIS — Y999 Unspecified external cause status: Secondary | ICD-10-CM | POA: Diagnosis not present

## 2019-03-07 DIAGNOSIS — S0990XA Unspecified injury of head, initial encounter: Secondary | ICD-10-CM | POA: Insufficient documentation

## 2019-03-07 DIAGNOSIS — W2209XA Striking against other stationary object, initial encounter: Secondary | ICD-10-CM | POA: Diagnosis not present

## 2019-03-07 NOTE — ED Triage Notes (Signed)
Pt reports he hit head on airplane door while walking, on Saturday and became dizzy. Pt was taken to [facility], he refused the CT but has come tonight due to blurred vision, HA and ringing in ears when pt woke this AM.

## 2019-03-07 NOTE — ED Notes (Signed)
NAD noted at time of D/C. Pt denies questions or concerns. Pt ambulatory to the lobby at this time. Unable to obtain E-sig, verbal consent for D/C obtained at this time.

## 2019-03-07 NOTE — ED Provider Notes (Signed)
[facility] Emergency Department Provider Note   ____________________________________________   First MD Initiated Contact with Patient 03/07/19 [phone]     (approximate)  I have reviewed the triage vital signs and the nursing notes.   HISTORY  Chief Complaint Head Injury    HPI Sean Rice is a 58 y.o. male with past medical history of migraine headaches who presents to the ED complaining of headache.  Patient reports that 2 days ago he struck his head on the top of an airplane door.  He denies losing consciousness but states he was very dizzy afterwards for about 5 minutes.  He initially felt fine and continue traveling to [place], but when he arrived there became increasingly dizzy and staff called EMS.  He was evaluated at [facility], but declined any imaging at that time.  He subsequently has had worsening headaches and dizziness, decided to get reevaluated because he was told to do so if symptoms were to worsen.  He denies any vision changes, numbness, or weakness.        Past Medical History:  Diagnosis Date  . Kidney stones     There are no active problems to display for this patient.   Past Surgical History:  Procedure Laterality Date  . CYSTOSCOPY/URETEROSCOPY/HOLMIUM LASER/STENT PLACEMENT Left 11/25/2017   Procedure: CYSTOSCOPY/URETEROSCOPY/HOLMIUM LASER/STENT PLACEMENT;  Surgeon: Hollice Espy, MD;  Location: [facility] ORS;  Service: Urology;  Laterality: Left;  . EXTRACORPOREAL SHOCK WAVE LITHOTRIPSY Left 11/25/2017   Procedure: EXTRACORPOREAL SHOCK WAVE LITHOTRIPSY (ESWL);  Surgeon: Hollice Espy, MD;  Location: [facility] ORS;  Service: Urology;  Laterality: Left;    Prior to Admission medications   Medication Sig Start Date End Date Taking? Authorizing Provider  ondansetron (ZOFRAN) 4 MG tablet Take 1 tablet (4 mg total) by mouth daily as needed. 11/24/17   Schaevitz, Randall An, MD  SUMAtriptan (IMITREX) 50 MG tablet Take 50 mg by mouth  every 2 (two) hours as needed for migraine (as needed for migranes). May repeat in 2 hours if headache persists or recurs.    [provider]    Allergies Patient has no known allergies.  History reviewed. No pertinent family history.  Social History Social History   Tobacco Use  . Smoking status: Current Every Day Smoker    Packs/day: 0.25    Years: 0.50    Pack years: 0.12    Types: Cigarettes  . Smokeless tobacco: Never Used  Substance Use Topics  . Alcohol use: Never    Frequency: Never  . Drug use: Never    Review of Systems  Constitutional: No fever/chills Eyes: No visual changes. ENT: No sore throat. Cardiovascular: Denies chest pain. Respiratory: Denies shortness of breath. Gastrointestinal: No abdominal pain.  No nausea, no vomiting.  No diarrhea.  No constipation. Genitourinary: Negative for dysuria. Musculoskeletal: Negative for back pain. Skin: Negative for rash. Neurological: Positive for headaches, negative for focal weakness or numbness.  ____________________________________________   PHYSICAL EXAM:  VITAL SIGNS: ED Triage Vitals [03/07/19 0335]  Enc Vitals Group     BP (!) 145/84     Pulse Rate 72     Resp 18     Temp 98 F (36.7 C)     Temp Source Oral     SpO2 99 %     Weight      Height      Head Circumference      Peak Flow      Pain Score  Pain Loc      Pain Edu?      Excl. in GC?     Constitutional: Alert and oriented. Eyes: Conjunctivae are normal.  Pupils equal round and reactive to light bilaterally, extraocular movements intact. Head: Atraumatic. Nose: No congestion/rhinnorhea. Mouth/Throat: Mucous membranes are moist. Neck: Normal ROM Cardiovascular: Normal rate, regular rhythm. Grossly normal heart sounds. Respiratory: Normal respiratory effort.  No retractions. Lungs CTAB. Gastrointestinal: Soft and nontender. No distention. Genitourinary: deferred Musculoskeletal: No lower extremity tenderness nor  edema. Neurologic:  Normal speech and language. No gross focal neurologic deficits are appreciated.  5 out of 5 strength in bilateral upper and lower extremities, no pronator drift. Skin:  Skin is warm, dry and intact. No rash noted. Psychiatric: Mood and affect are normal. Speech and behavior are normal.  ____________________________________________   LABS (all labs ordered are listed, but only abnormal results are displayed)  Labs Reviewed - No data to display   PROCEDURES  Procedure(s) performed (including Critical Care):  Procedures   ____________________________________________   INITIAL IMPRESSION / ASSESSMENT AND PLAN / ED COURSE       58 year old male presents to the ED 2 days after striking his head on an airplane door, now having worsening headache and dizziness.  He has a benign and nonfocal neurologic exam, head C-spine CT are negative for acute process.  No apparent maxillofacial injury.  Suspect postconcussive syndrome, counseled patient on concussion precautions and to follow-up with PCP.  Patient agrees with plan.      ____________________________________________   FINAL CLINICAL IMPRESSION(S) / ED DIAGNOSES  Final diagnoses:  Post concussive syndrome     ED Discharge Orders    None       Note:  This document was prepared using Dragon voice recognition software and may include unintentional dictation errors.   Chesley Noon, MD 03/07/19 [phone]

## 2019-03-07 NOTE — ED Notes (Signed)
Pt presents to ED via POV, states was getting off of an airplane when he hit his head and was knocked back. Pt denies LOC at this time, states was dizzy for approx 5 mins. Pt states was evaluated at [facility] and dx with concussion and told to come here if symptoms got worse. Pt states he has a pounding HA at this time, pt also c/o intermittent dizziness. Pt states hx of migraines. Pt A&O x4, NAD noted at this time, respirations even and unlabored, skin warm,dry, and intact.

## 2020-10-02 ENCOUNTER — Other Ambulatory Visit: Payer: Self-pay

## 2020-10-02 ENCOUNTER — Encounter: Payer: Self-pay | Admitting: Emergency Medicine

## 2020-10-02 DIAGNOSIS — M549 Dorsalgia, unspecified: Secondary | ICD-10-CM | POA: Diagnosis not present

## 2020-10-02 DIAGNOSIS — Z5321 Procedure and treatment not carried out due to patient leaving prior to being seen by health care provider: Secondary | ICD-10-CM | POA: Diagnosis not present

## 2020-10-02 LAB — URINALYSIS, COMPLETE (UACMP) WITH MICROSCOPIC
Bacteria, UA: NONE SEEN
Bilirubin Urine: NEGATIVE
Glucose, UA: NEGATIVE mg/dL
Ketones, ur: NEGATIVE mg/dL
Leukocytes,Ua: NEGATIVE
Nitrite: NEGATIVE
Protein, ur: NEGATIVE mg/dL
Specific Gravity, Urine: 1.025 (ref 1.005–1.030)
Squamous Epithelial / LPF: NONE SEEN (ref 0–5)
pH: 5 (ref 5.0–8.0)

## 2020-10-02 NOTE — ED Triage Notes (Addendum)
Pt arrived via POV with reports of right side back pain that shoots down R leg to foot x2 days. 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

## 2020-10-03 ENCOUNTER — Ambulatory Visit
Admission: EM | Admit: 2020-10-03 | Discharge: 2020-10-03 | Disposition: A | Discharge status: Home / Self Care | Payer: PRIVATE HEALTH INSURANCE | Attending: Emergency Medicine | Admitting: Emergency Medicine

## 2020-10-03 ENCOUNTER — Emergency Department
Admission: EM | Admit: 2020-10-03 | Discharge: 2020-10-03 | Disposition: A | Discharge status: Home / Self Care | Payer: PRIVATE HEALTH INSURANCE | Attending: Emergency Medicine | Admitting: Emergency Medicine

## 2020-10-03 DIAGNOSIS — M5441 Lumbago with sciatica, right side: Secondary | ICD-10-CM

## 2020-10-03 DIAGNOSIS — H1031 Unspecified acute conjunctivitis, right eye: Secondary | ICD-10-CM

## 2020-10-03 MED ORDER — CYCLOBENZAPRINE HCL 10 MG PO TABS: 10.0000 mg | ORAL_TABLET | Freq: Two times a day (BID) | ORAL | 0 refills | Status: DC | PRN

## 2020-10-03 MED ORDER — POLYMYXIN B-TRIMETHOPRIM 10000-0.1 UNIT/ML-% OP SOLN: 1.0000 [drp] | Freq: Four times a day (QID) | OPHTHALMIC | 0 refills | Status: AC

## 2020-10-03 NOTE — Discharge Instructions (Addendum)
Take ibuprofen as needed for discomfort.  Take the muscle relaxer as needed for muscle spasm; Do not drive, operate machinery, or drink alcohol with this medication as it can cause drowsiness. Follow up with your primary care provider or an orthopedist if your symptoms are not improving.    Use the antibiotic eyedrops as prescribed.  Follow-up with your eye doctor for a recheck in 1 to 2 days if your symptoms are not improving.  Go to the emergency department if you have acute eye pain, changes in your vision, or other concerning symptoms.    

## 2020-10-03 NOTE — ED Provider Notes (Signed)
Sean Rice    CSN: [id number] Arrival date & time: 10/03/20  1527      History   Chief Complaint Chief Complaint  Patient presents with  . Back Pain    HPI Sean Rice is a 60 y.o. male.   Patient presents with 4-day history of right lower back pain which is radiating down his right leg.  Eating at home with ibuprofen.  No falls or injury.  He denies numbness, weakness, paresthesias, saddle anesthesia, loss of bowel/bladder control, abdominal pain, dysuria, hematuria.  He also reports right eye redness, irritation, and itching.  No injury.  No changes in vision, acute eye pain, drainage, fever, or other symptoms.  No treatments attempted at home.  His medical history includes kidney stones.  The history is provided by the patient.    Past Medical History:  Diagnosis Date  . Kidney stones     There are no problems to display for this patient.   Past Surgical History:  Procedure Laterality Date  . CYSTOSCOPY/URETEROSCOPY/HOLMIUM LASER/STENT PLACEMENT Left 11/25/2017   Procedure: CYSTOSCOPY/URETEROSCOPY/HOLMIUM LASER/STENT PLACEMENT;  Surgeon: Vanna Scotland, MD;  Location: [facility] ORS;  Service: Urology;  Laterality: Left;  . EXTRACORPOREAL SHOCK WAVE LITHOTRIPSY Left 11/25/2017   Procedure: EXTRACORPOREAL SHOCK WAVE LITHOTRIPSY (ESWL);  Surgeon: Vanna Scotland, MD;  Location: [facility] ORS;  Service: Urology;  Laterality: Left;       Home Medications    Prior to Admission medications   Medication Sig Start Date End Date Taking? Authorizing Provider  cyclobenzaprine (FLEXERIL) 10 MG tablet Take 1 tablet (10 mg total) by mouth 2 (two) times daily as needed for muscle spasms. 10/03/20  Yes Mickie Bail, NP  trimethoprim-polymyxin b (POLYTRIM) ophthalmic solution Place 1 drop into both eyes 4 (four) times daily for 7 days. 10/03/20 10/10/20 Yes Mickie Bail, NP  ondansetron (ZOFRAN) 4 MG tablet Take 1 tablet (4 mg total) by mouth daily as needed. 11/24/17   Schaevitz, Myra Rude, MD  SUMAtriptan (IMITREX) 50 MG tablet Take 50 mg by mouth every 2 (two) hours as needed for migraine (as needed for migranes). May repeat in 2 hours if headache persists or recurs.    [provider]    Family History No family history on file.  Social History Social History   Tobacco Use  . Smoking status: Current Every Day Smoker    Packs/day: 0.25    Years: 0.50    Pack years: 0.12    Types: Cigarettes  . Smokeless tobacco: Never Used  Substance Use Topics  . Alcohol use: Never  . Drug use: Never     Allergies   Patient has no known allergies.   Review of Systems Review of Systems  Constitutional: Negative for chills and fever.  Eyes: Positive for redness and itching. Negative for photophobia, pain, discharge and visual disturbance.  Respiratory: Negative for cough and shortness of breath.   Cardiovascular: Negative for chest pain and palpitations.  Gastrointestinal: Negative for abdominal pain and vomiting.  Genitourinary: Negative for dysuria and hematuria.  Musculoskeletal: Positive for back pain. Negative for arthralgias.  Skin: Negative for color change and rash.  Neurological: Negative for syncope and numbness.  All other systems reviewed and are negative.    Physical Exam Triage Vital Signs ED Triage Vitals  Enc Vitals Group     BP      Pulse      Resp      Temp      Temp src  SpO2      Weight      Height      Head Circumference      Peak Flow      Pain Score      Pain Loc      Pain Edu?      Excl. in GC?    No data found.  Updated Vital Signs BP 131/75   Pulse 74   Temp 98.1 F (36.7 C) (Oral)   Resp 18   Ht 6\' 2"  (1.88 m)   Wt (!) 310 lb (140.6 kg)   SpO2 98%   BMI 39.80 kg/m   Visual Acuity Right Eye Distance:   Left Eye Distance:   Bilateral Distance:    Right Eye Near:   Left Eye Near:    Bilateral Near:     Physical Exam Vitals and nursing note reviewed.  Constitutional:      General: He is  not in acute distress.    Appearance: He is well-developed.  HENT:     Head: Normocephalic and atraumatic.     Mouth/Throat:     Mouth: Mucous membranes are moist.  Eyes:     General: Lids are normal. Vision grossly intact.     Extraocular Movements: Extraocular movements intact.     Conjunctiva/sclera:     Right eye: Right conjunctiva is injected.     Left eye: Left conjunctiva is not injected.     Pupils: Pupils are equal, round, and reactive to light.  Cardiovascular:     Rate and Rhythm: Normal rate and regular rhythm.     Heart sounds: Normal heart sounds.  Pulmonary:     Effort: Pulmonary effort is normal. No respiratory distress.     Breath sounds: Normal breath sounds.  Abdominal:     Palpations: Abdomen is soft.     Tenderness: There is no abdominal tenderness.  Musculoskeletal:        General: No swelling, deformity or signs of injury. Normal range of motion.     Cervical back: Neck supple.  Skin:    General: Skin is warm and dry.     Capillary Refill: Capillary refill takes less than 2 seconds.     Findings: No bruising, erythema, lesion or rash.  Neurological:     General: No focal deficit present.     Mental Status: He is alert and oriented to person, place, and time.     Sensory: No sensory deficit.     Motor: No weakness.     Gait: Gait normal.  Psychiatric:        Mood and Affect: Mood normal.      UC Treatments / Results  Labs (all labs ordered are listed, but only abnormal results are displayed) Labs Reviewed - No data to display  EKG   Radiology No results found.  Procedures Procedures (including critical care time)  Medications Ordered in UC Medications - No data to display  Initial Impression / Assessment and Plan / UC Course  I have reviewed the triage vital signs and the nursing notes.  Pertinent labs & imaging results that were available during my care of the patient were reviewed by me and considered in my medical decision making  (see chart for details).   Acute right lower back pain with right-sided sciatica.  Acute bacterial conjunctivitis of the right eye.  Treating back pain with Flexeril.  Precautions for drowsiness with this medication discussed.  Patient declines prescription for ibuprofen today.  Instructed  him to follow-up with his PCP or an orthopedist if his symptoms are not improving.  Treating conjunctivitis with Polytrim eyedrops.  Instructed patient to follow-up with his eye care provider if his symptoms or not improving.  ED precautions discussed.  Patient agrees to plan of care.  Final Clinical Impressions(s) / UC Diagnoses   Final diagnoses:  Acute right-sided low back pain with right-sided sciatica  Acute bacterial conjunctivitis of right eye     Discharge Instructions     Take ibuprofen as needed for discomfort.  Take the muscle relaxer as needed for muscle spasm; Do not drive, operate machinery, or drink alcohol with this medication as it can cause drowsiness.   Follow up with your primary care provider or an orthopedist if your symptoms are not improving.    Use the antibiotic eyedrops as prescribed.    Follow-up with your eye doctor for a recheck in 1 to 2 days if your symptoms are not improving.    Go to the emergency department if you have acute eye pain, changes in your vision, or other concerning symptoms.         ED Prescriptions    Medication Sig Dispense Auth. Provider   cyclobenzaprine (FLEXERIL) 10 MG tablet Take 1 tablet (10 mg total) by mouth 2 (two) times daily as needed for muscle spasms. 20 tablet Mickie Bail, NP   trimethoprim-polymyxin b (POLYTRIM) ophthalmic solution Place 1 drop into both eyes 4 (four) times daily for 7 days. 10 mL Mickie Bail, NP     I have reviewed the PDMP during this encounter.   Mickie Bail, NP 10/03/20 [phone]

## 2020-10-03 NOTE — ED Triage Notes (Signed)
Pt reports having lower back pain that radiates down leg. Pain is on the R side. Have been taking otc meds without relief. No known injury to back.

## 2020-10-07 ENCOUNTER — Emergency Department
Admission: EM | Admit: 2020-10-07 | Discharge: 2020-10-07 | Disposition: A | Discharge status: Home / Self Care | Payer: PRIVATE HEALTH INSURANCE | Attending: Emergency Medicine | Admitting: Emergency Medicine

## 2020-10-07 ENCOUNTER — Encounter: Payer: Self-pay | Admitting: Intensive Care

## 2020-10-07 ENCOUNTER — Emergency Department: Payer: PRIVATE HEALTH INSURANCE

## 2020-10-07 ENCOUNTER — Other Ambulatory Visit: Payer: Self-pay

## 2020-10-07 DIAGNOSIS — R911 Solitary pulmonary nodule: Secondary | ICD-10-CM | POA: Diagnosis not present

## 2020-10-07 DIAGNOSIS — N23 Unspecified renal colic: Secondary | ICD-10-CM

## 2020-10-07 DIAGNOSIS — M545 Low back pain, unspecified: Secondary | ICD-10-CM | POA: Diagnosis present

## 2020-10-07 DIAGNOSIS — Z955 Presence of coronary angioplasty implant and graft: Secondary | ICD-10-CM | POA: Diagnosis not present

## 2020-10-07 DIAGNOSIS — F17210 Nicotine dependence, cigarettes, uncomplicated: Secondary | ICD-10-CM | POA: Diagnosis not present

## 2020-10-07 DIAGNOSIS — M5416 Radiculopathy, lumbar region: Secondary | ICD-10-CM | POA: Diagnosis not present

## 2020-10-07 HISTORY — DX: Migraine, unspecified, not intractable, without status migrainosus: G43.909

## 2020-10-07 LAB — BASIC METABOLIC PANEL
Anion gap: 8 (ref 5–15)
BUN: 24 mg/dL — ABNORMAL HIGH (ref 6–20)
CO2: 24 mmol/L (ref 22–32)
Calcium: 8.9 mg/dL (ref 8.9–10.3)
Chloride: 106 mmol/L (ref 98–111)
Creatinine, Ser: 1.38 mg/dL — ABNORMAL HIGH (ref 0.61–1.24)
GFR, Estimated: 60 mL/min — ABNORMAL LOW (ref >60)
Glucose, Bld: 91 mg/dL (ref 70–99)
Potassium: 4 mmol/L (ref 3.5–5.1)
Sodium: 138 mmol/L (ref 135–145)

## 2020-10-07 LAB — URINALYSIS, COMPLETE (UACMP) WITH MICROSCOPIC
Bilirubin Urine: NEGATIVE
Glucose, UA: NEGATIVE mg/dL
Ketones, ur: NEGATIVE mg/dL
Leukocytes,Ua: NEGATIVE
Nitrite: NEGATIVE
Protein, ur: 30 mg/dL — AB
RBC / HPF: >50 RBC/hpf — ABNORMAL HIGH (ref 0–5)
Specific Gravity, Urine: 1.021 (ref 1.005–1.030)
Squamous Epithelial / LPF: NONE SEEN (ref 0–5)
pH: 5 (ref 5.0–8.0)

## 2020-10-07 LAB — CBC
HCT: 44.9 % (ref 39.0–52.0)
Hemoglobin: 15.3 g/dL (ref 13.0–17.0)
MCH: 31.4 pg (ref 26.0–34.0)
MCHC: 34.1 g/dL (ref 30.0–36.0)
MCV: 92 fL (ref 80.0–100.0)
Platelets: 301 10*3/uL (ref 150–400)
RBC: 4.88 MIL/uL (ref 4.22–5.81)
RDW: 13.1 % (ref 11.5–15.5)
WBC: 10.6 10*3/uL — ABNORMAL HIGH (ref 4.0–10.5)
nRBC: 0 % (ref 0.0–0.2)

## 2020-10-07 MED ORDER — HYDROCODONE-ACETAMINOPHEN 5-325 MG PO TABS: 1.0000 | ORAL_TABLET | Freq: Four times a day (QID) | ORAL | 0 refills | Status: AC | PRN

## 2020-10-07 MED ORDER — PREDNISONE 20 MG PO TABS
60.0000 mg | ORAL_TABLET | Freq: Once | ORAL | Status: AC
  Administered 2020-10-07: 60 mg via ORAL
  Filled 2020-10-07: qty 3

## 2020-10-07 MED ORDER — MORPHINE SULFATE (PF) 4 MG/ML IV SOLN
4.0000 mg | Freq: Once | INTRAVENOUS | Status: AC
  Administered 2020-10-07: 4 mg via INTRAVENOUS
  Filled 2020-10-07: qty 1

## 2020-10-07 MED ORDER — OXYCODONE-ACETAMINOPHEN 5-325 MG PO TABS
1.0000 | ORAL_TABLET | ORAL | Status: DC | PRN
  Administered 2020-10-07: 1 via ORAL
  Filled 2020-10-07: qty 1

## 2020-10-07 MED ORDER — PREDNISONE 10 MG (21) PO TBPK: ORAL_TABLET | ORAL | 0 refills | Status: DC

## 2020-10-07 MED ORDER — ONDANSETRON HCL 4 MG/2ML IJ SOLN
4.0000 mg | Freq: Once | INTRAMUSCULAR | Status: AC
  Administered 2020-10-07: 4 mg via INTRAVENOUS
  Filled 2020-10-07: qty 2

## 2020-10-07 NOTE — Discharge Instructions (Signed)
A small nodule was found on your lung. It has been seen in previous imaging, but you should follow up with primary care and have a repeat scan in about 3 months just to make sure it is nothing of concern.  Follow up with urology if you continue to have kidney stone type pain and blood in your urine.   Return to the ER for symptoms that change, worsen or for new concerns if unable to see PCP or the specialist.

## 2020-10-07 NOTE — ED Triage Notes (Addendum)
Patient c/o left lower back pain that radiates down leg. Also c/o burning and blood during urination. Reports history of kidney stones and reports it almost feels the same

## 2020-10-07 NOTE — ED Notes (Signed)
This RN bladder scanned pt and the highest amount recorded was 78 mls

## 2020-10-07 NOTE — ED Provider Notes (Signed)
[facility] Emergency Department Provider Note ____________________________________________  Time seen: Approximately 2:58 PM  I have reviewed the triage vital signs and the nursing notes.  HISTORY  Chief Complaint Sciatica, Nephrolithiasis, and Flank Pain   HPI Sean Rice is a 60 y.o. male who presents to the emergency department for treatment and evaluation of lower back pain with numbness and tingling that radiates down the right leg and into the right foot.  Symptoms started on Friday.  He had to fly to [place] for his son's graduation Saturday morning and stopped by an urgent care while there.  He was treated with muscle relaxers which make him sleepy but otherwise do not help with the radicular pain.  He states that last night on his return trip home he went to the restroom in the [place] airport and had frank blood in his urine.  Since then, he has not been able to urinate.  Past Medical History:  Diagnosis Date  . Kidney stones   . Migraine     There are no problems to display for this patient.   Past Surgical History:  Procedure Laterality Date  . CYSTOSCOPY/URETEROSCOPY/HOLMIUM LASER/STENT PLACEMENT Left 11/25/2017   Procedure: CYSTOSCOPY/URETEROSCOPY/HOLMIUM LASER/STENT PLACEMENT;  Surgeon: Vanna Scotland, MD;  Location: [facility] ORS;  Service: Urology;  Laterality: Left;  . EXTRACORPOREAL SHOCK WAVE LITHOTRIPSY Left 11/25/2017   Procedure: EXTRACORPOREAL SHOCK WAVE LITHOTRIPSY (ESWL);  Surgeon: Vanna Scotland, MD;  Location: [facility] ORS;  Service: Urology;  Laterality: Left;    Prior to Admission medications   Medication Sig Start Date End Date Taking? Authorizing Provider  HYDROcodone-acetaminophen (NORCO/VICODIN) 5-325 MG tablet Take 1 tablet by mouth every 6 (six) hours as needed for up to 3 days for severe pain. 10/07/20 10/10/20 Yes Alcide Memoli B, FNP  predniSONE (STERAPRED UNI-PAK 21 TAB) 10 MG (21) TBPK tablet Take 6 tablets on the first day and  decrease by 1 tablet each day until finished. 10/07/20  Yes Kayden Hutmacher B, FNP  cyclobenzaprine (FLEXERIL) 10 MG tablet Take 1 tablet (10 mg total) by mouth 2 (two) times daily as needed for muscle spasms. 10/03/20   Mickie Bail, NP  ondansetron (ZOFRAN) 4 MG tablet Take 1 tablet (4 mg total) by mouth daily as needed. 11/24/17   Schaevitz, Myra Rude, MD  SUMAtriptan (IMITREX) 50 MG tablet Take 50 mg by mouth every 2 (two) hours as needed for migraine (as needed for migranes). May repeat in 2 hours if headache persists or recurs.    [provider]  trimethoprim-polymyxin b (POLYTRIM) ophthalmic solution Place 1 drop into both eyes 4 (four) times daily for 7 days. 10/03/20 10/10/20  Mickie Bail, NP    Allergies Patient has no known allergies.  History reviewed. No pertinent family history.  Social History Social History   Tobacco Use  . Smoking status: Current Every Day Smoker    Packs/day: 0.25    Years: 0.50    Pack years: 0.12    Types: Cigarettes  . Smokeless tobacco: Never Used  Substance Use Topics  . Alcohol use: Never  . Drug use: Never    Review of Systems Constitutional: Well appearing. Respiratory: Negative for dyspnea. Cardiovascular: Negative for change in skin temperature or color. Musculoskeletal:   Negative for chronic steroid use   Negative for trauma in the presence of osteoporosis  Negative for age over 89 and trauma.  Negative for constitutional symptoms, or history of cancer.  Negative for pain worse at night. Skin: Negative for  rash, lesion, or wound.  Genitourinary: Negative for urinary retention. Rectal: Negative for fecal incontinence or new onset constipation/bowel habit changes. Hematological/Immunilogical: Negative for immunosuppression, IV drug use, or fever Neurological: Positive for burning, tingling, numb, electric, radiating pain in the right.                        Negative for saddle anesthesia.                        Negative  for focal neurologic deficit, progressive or disabling symptoms             Negative for saddle anesthesia. ____________________________________________   PHYSICAL EXAM:  VITAL SIGNS: ED Triage Vitals [10/07/20 1417]  Enc Vitals Group     BP 134/83     Pulse Rate 64     Resp 16     Temp 98.2 F (36.8 C)     Temp Source Oral     SpO2 97 %     Weight (!) 310 lb (140.6 kg)     Height 6\' 2"  (1.88 m)     Head Circumference      Peak Flow      Pain Score 10     Pain Loc      Pain Edu?      Excl. in GC?     Constitutional: Alert and oriented. Well appearing and in no acute distress. Eyes: Conjunctivae are clear without discharge or drainage.  Head: Atraumatic. Neck: Full, active range of motion. Respiratory: Respirations even and unlabored. Musculoskeletal: Limited ROM of the back and extremities, Strength 5/5 of the lower extremities as tested. Neurologic: Reflexes of the lower extremities are 2+. Positive straight leg raise on the right side. Skin: Atraumatic.  Psychiatric: Behavior and affect are normal.  ____________________________________________   LABS (all labs ordered are listed, but only abnormal results are displayed)  Labs Reviewed  URINALYSIS, COMPLETE (UACMP) WITH MICROSCOPIC - Abnormal; Notable for the following components:      Result Value   Color, Urine YELLOW (*)    APPearance CLOUDY (*)    Hgb urine dipstick LARGE (*)    Protein, ur 30 (*)    RBC / HPF >50 (*)    Bacteria, UA RARE (*)    All other components within normal limits  BASIC METABOLIC PANEL - Abnormal; Notable for the following components:   BUN 24 (*)    Creatinine, Ser 1.38 (*)    GFR, Estimated 60 (*)    All other components within normal limits  CBC - Abnormal; Notable for the following components:   WBC 10.6 (*)    All other components within normal limits   ____________________________________________  RADIOLOGY  Incidental finding of lung nodule on CT. Recently passed  left renal calculus or a left UVJ stone with mild left hydronephrosis. Multilevel mild diffuse disc bulge L2-S1 ____________________________________________   PROCEDURES  Procedure(s) performed:  Procedures ____________________________________________   INITIAL IMPRESSION / ASSESSMENT AND PLAN / ED COURSE  Sean Rice is a 60 y.o. male presenting to the emergency department for treatment and evaluation of right flank pain and right back pain that radiates into the right leg and foot.  See HPI for further details.  Plan will be to get a CT for renal stone study and also have them pull the thoracic spine images out from that study.  He states he has not urinated since last night but feels that  he needs to.  Bladder scan also ordered.  Less than in bladder. Patient was able to provide a urine specimen.   CT shows likely recent passed stone or retained stone at UVJ on left. Also on L-spine pull out, mild disc bulge.   Plan will be to treat sciatica with prednisone. Norco for renal colic and sciatica, and have him follow up with primary care as well as urology.   Medications  oxyCODONE-acetaminophen (PERCOCET/ROXICET) 5-325 MG per tablet 1 tablet (1 tablet Oral Given 10/07/20 1428)  predniSONE (DELTASONE) tablet 60 mg (has no administration in time range)  morphine 4 MG/ML injection 4 mg (4 mg Intravenous Given 10/07/20 1623)  ondansetron (ZOFRAN) injection 4 mg (4 mg Intravenous Given 10/07/20 1623)    ED Discharge Orders         Ordered    predniSONE (STERAPRED UNI-PAK 21 TAB) 10 MG (21) TBPK tablet        10/07/20 1749    HYDROcodone-acetaminophen (NORCO/VICODIN) 5-325 MG tablet  Every 6 hours PRN        10/07/20 1749           Pertinent labs & imaging results that were available during my care of the patient were reviewed by me and considered in my medical decision making (see chart for details).  _________________________________________   FINAL CLINICAL  IMPRESSION(S) / ED DIAGNOSES  Final diagnoses:  Acute right lumbar radiculopathy  Renal colic on left side  Lung nodule     If controlled substance prescribed during this visit, 12 month history viewed on the [facility] prior to issuing an initial prescription for Schedule II or III opiod.   Chinita Pester, FNP 10/07/20 1810    Chesley Noon, MD 10/10/20 [phone]

## 2020-12-18 ENCOUNTER — Ambulatory Visit
Admission: EM | Admit: 2020-12-18 | Discharge: 2020-12-18 | Disposition: A | Discharge status: Home / Self Care | Payer: PRIVATE HEALTH INSURANCE | Attending: Emergency Medicine | Admitting: Emergency Medicine

## 2020-12-18 ENCOUNTER — Encounter: Payer: Self-pay | Admitting: Emergency Medicine

## 2020-12-18 ENCOUNTER — Other Ambulatory Visit: Payer: Self-pay

## 2020-12-18 DIAGNOSIS — B349 Viral infection, unspecified: Secondary | ICD-10-CM

## 2020-12-18 NOTE — ED Triage Notes (Signed)
Pt here with nasal congestion, nausea, body aches and loss of taste and smell since Saturday.

## 2020-12-18 NOTE — Discharge Instructions (Addendum)

## 2020-12-18 NOTE — ED Provider Notes (Signed)
CHIEF COMPLAINT:  Nasal congestion, nausea, body aches, loss of taste/smell    SUBJECTIVE/HPI:  HPI A very pleasant 60 y.o.Male presents today with nasal congestion, nausea, body aches and loss of taste/smell which started on Saturday.  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  Patient states that he is vaccinated with [facility].  Patient does not report any known recent sick contacts. Patient does not report any shortness of breath, chest pain, palpitations, visual changes, weakness, tingling, headache, nausea, vomiting, diarrhea, fever, chills.   has a past medical history of Kidney stones and Migraine.  ROS:  Review of Systems See Subjective/HPI Medications, Allergies and Problem List personally reviewed in Epic today OBJECTIVE:   Vitals:   12/18/20 1855  BP: 124/89  Pulse: 78  Resp: (!) 22  Temp: 98 F (36.7 C)  SpO2: 97%    Physical Exam   General: Appears well-developed and well-nourished. No acute distress.  HEENT Head: Normocephalic and atraumatic.   Ears: Hearing grossly intact, no drainage or visible deformity.  Nose: No nasal deviation.   Mouth/Throat: No stridor or tracheal deviation.  Non erythematous posterior pharynx noted with clear drainage present.  No white patchy exudate noted. Eyes: Conjunctivae and EOM are normal. No eye drainage or scleral icterus bilaterally.  Neck: Normal range of motion, neck is supple. No cervical, tonsillar or submandibular lymph nodes palpated.   Cardiovascular: Normal rate. Regular rhythm; no murmurs, gallops, or rubs.  Pulm/Chest: No respiratory distress. Breath sounds normal bilaterally without wheezes, rhonchi, or rales.  Neurological: Alert and oriented to person, place, and time.  Skin: Skin is warm and dry.  No rashes, lesions, abrasions or bruising noted to skin.   Psychiatric: Normal mood, affect, behavior, and thought content.   Vital signs and nursing note reviewed.    Patient stable and cooperative with examination. PROCEDURES:    LABS/X-RAYS/EKG/MEDS:   No results found for any visits on 12/18/20.  MEDICAL DECISION MAKING:   Patient presents with nasal congestion, nausea, body aches and loss of taste/smell which started on Saturday.  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  Patient states that he is vaccinated with [facility].  Patient does not report any known recent sick contacts. Patient does not report any shortness of breath, chest pain, palpitations, visual changes, weakness, tingling, headache, nausea, vomiting, diarrhea, fever, chills.  Chart review completed.  COVID-19 PCR obtained in clinic today.  Likely, viral illness.  Advised of at home treatment and care as outlined in his AVS to include fluids, Tylenol, ibuprofen, Mucinex and rest.  Advised of strict return/emergency department precautions for severe worsening of symptoms.  Patient verbalized understanding and agreed with treatment plan.  Patient stable upon discharge. ASSESSMENT/PLAN:  1. Viral illness - Novel Coronavirus, NAA ([facility]); Standing - Novel Coronavirus, NAA ([facility])  Instructions about new medications and side effects provided.  Plan:   Discharge Instructions      We will call you with any positive results from your COVID-19 testing completed in clinic today.  If you do not receive a phone call from [place] within the next 2-3 days, check your MyChart for up-to-date health information related to testing completed in clinic today.  For most people this is a self-limiting process and can take anywhere from 7 - 10 days to start feeling better. A cough can last up to 3 weeks. Pay special attention to handwashing  as this can help prevent the spread of the virus.   Always read the labels of cough and cold medications as they may contain some of the ingredients below.  Rest, push lots of fluids (especially water), and  utilize supportive care for symptoms. You may take acetaminophen (Tylenol) every 4-6 hours and ibuprofen every 6-8 hours for muscle pain, joint pain, headaches (you may also alternate these medications). Mucinex (guaifenesin) may be taken over the counter for cough as needed can loosen phlegm. Please read the instructions and take as directed.  Sudafed (pseudophedrine) is sold behind the counter and can help reduce nasal pressure; avoid taking this if you have high blood pressure or feel jittery. Sudafed PE (phenylephrine) can be a helpful, short-term, over-the-counter alternative to limit side effects or if you have high blood pressure.  Flonase nasal spray can help alleviate congestion and sinus pressure. Many patients choose Afrin as a nasal decongestant; do not use for more than 3 days for risk of rebound (increased symptoms after stopping medication).  Saline nasal sprays or rinses can also help nasal congestion (use bottled or sterile water). Warm tea with lemon and honey can sooth sore throat and cough, as can cough drops.   Return to clinic for high fever not improving with medications, chest pain, difficulty breathing, non-stop vomiting, or coughing blood. Follow-up with your primary care provider if symptoms do not improve as expected in the next 5-7 days.          Amalia Greenhouse, FNP 12/18/20 1939

## 2020-12-19 LAB — NOVEL CORONAVIRUS, NAA: SARS-CoV-2, NAA: DETECTED — AB

## 2020-12-19 LAB — SARS-COV-2, NAA 2 DAY TAT

## 2021-02-05 ENCOUNTER — Encounter (HOSPITAL_COMMUNITY): Payer: Self-pay | Admitting: Radiology

## 2021-06-09 ENCOUNTER — Encounter: Payer: Self-pay | Admitting: Emergency Medicine

## 2021-06-09 ENCOUNTER — Ambulatory Visit (INDEPENDENT_AMBULATORY_CARE_PROVIDER_SITE_OTHER): Payer: 59

## 2021-06-09 ENCOUNTER — Ambulatory Visit
Admission: EM | Admit: 2021-06-09 | Discharge: 2021-06-09 | Disposition: A | Discharge status: Home / Self Care | Payer: 59 | Attending: Physician Assistant | Admitting: Physician Assistant

## 2021-06-09 ENCOUNTER — Other Ambulatory Visit: Payer: Self-pay

## 2021-06-09 DIAGNOSIS — R0600 Dyspnea, unspecified: Secondary | ICD-10-CM

## 2021-06-09 MED ORDER — SUMATRIPTAN SUCCINATE 50 MG PO TABS: 50.0000 mg | ORAL_TABLET | Freq: Once | ORAL | 1 refills | Status: DC | PRN

## 2021-06-09 MED ORDER — HYDROCHLOROTHIAZIDE 25 MG PO TABS: 25.0000 mg | ORAL_TABLET | Freq: Every day | ORAL | 2 refills | Status: AC

## 2021-06-09 NOTE — ED Triage Notes (Signed)
Pt here with steady 2 lb weight gain per week, bilateral leg swelling, SOB, and medication refill of imitrex.

## 2021-06-09 NOTE — ED Provider Notes (Signed)
Sean Rice    CSN: [id number] Arrival date & time: 06/09/21  1436      History   Chief Complaint Chief Complaint  Patient presents with   Shortness of Breath   Leg Swelling   Medication Refill    HPI Sean Rice is a 61 y.o. male.   Pt reports he feels like he is gaining 2 pounds a day.  Pt is worried that he has heart disease.  Pt reports both legs are swollen   The history is provided by the patient. No language interpreter was used.  Shortness of Breath Severity:  Severe Onset quality:  Gradual Duration:  2 months Timing:  Constant Progression:  Worsening Chronicity:  New Relieved by:  Nothing Worsened by:  Nothing Ineffective treatments:  None tried Associated symptoms: headaches   Associated symptoms: no chest pain   Medication Refill  Past Medical History:  Diagnosis Date   Kidney stones    Migraine     There are no problems to display for this patient.   Past Surgical History:  Procedure Laterality Date   CYSTOSCOPY/URETEROSCOPY/HOLMIUM LASER/STENT PLACEMENT Left 11/25/2017   Procedure: CYSTOSCOPY/URETEROSCOPY/HOLMIUM LASER/STENT PLACEMENT;  Surgeon: Hollice Espy, MD;  Location: [facility];  Service: Urology;  Laterality: Left;   EXTRACORPOREAL SHOCK WAVE LITHOTRIPSY Left 11/25/2017   Procedure: EXTRACORPOREAL SHOCK WAVE LITHOTRIPSY (ESWL);  Surgeon: Hollice Espy, MD;  Location: [facility];  Service: Urology;  Laterality: Left;       Home Medications    Prior to Admission medications   Medication Sig Start Date End Date Taking? Authorizing Provider  hydrochlorothiazide (HYDRODIURIL) 25 MG tablet Take 1 tablet (25 mg total) by mouth daily. 06/09/21  Yes Caryl Ada K, PA-C  SUMAtriptan (IMITREX) 50 MG tablet Take 1 tablet (50 mg total) by mouth once as needed for migraine. May repeat in 2 hours if headache persists or recurs. 06/09/21 06/10/22 Yes Fransico Meadow, PA-C  cyclobenzaprine (FLEXERIL) 10 MG tablet Take 1 tablet (10 mg total)  by mouth 2 (two) times daily as needed for muscle spasms. 10/03/20   Sharion Balloon, NP  ondansetron (ZOFRAN) 4 MG tablet Take 1 tablet (4 mg total) by mouth daily as needed. 11/24/17   Schaevitz, Randall An, MD  predniSONE (STERAPRED UNI-PAK 21 TAB) 10 MG (21) TBPK tablet Take 6 tablets on the first day and decrease by 1 tablet each day until finished. 10/07/20   Victorino Dike, FNP    Family History History reviewed. No pertinent family history.  Social History Social History   Tobacco Use   Smoking status: Every Day    Packs/day: 0.25    Years: 0.50    Pack years: 0.13    Types: Cigarettes   Smokeless tobacco: Never  Substance Use Topics   Alcohol use: Never   Drug use: Never     Allergies   Patient has no known allergies.   Review of Systems Review of Systems  Respiratory:  Positive for shortness of breath.   Cardiovascular:  Positive for leg swelling. Negative for chest pain.  Neurological:  Positive for headaches.  All other systems reviewed and are negative.   Physical Exam Triage Vital Signs ED Triage Vitals  Enc Vitals Group     BP 06/09/21 1454 (!) 160/91     Pulse Rate 06/09/21 1454 65     Resp 06/09/21 1454 18     Temp 06/09/21 1454 98.1 F (36.7 C)     Temp Source 06/09/21 1454 Oral  SpO2 06/09/21 1454 95 %     Weight --      Height --      Head Circumference --      Peak Flow --      Pain Score 06/09/21 1521 3     Pain Loc --      Pain Edu? --      Excl. in [place]? --    No data found.  Updated Vital Signs BP (!) 160/91 (BP Location: Right Arm)    Pulse 65    Temp 98.1 F (36.7 C) (Oral)    Resp 18    SpO2 95%   Visual Acuity Right Eye Distance:   Left Eye Distance:   Bilateral Distance:    Right Eye Near:   Left Eye Near:    Bilateral Near:     Physical Exam Vitals and nursing note reviewed.  Constitutional:      Appearance: He is well-developed.  HENT:     Head: Normocephalic.  Cardiovascular:     Rate and Rhythm: Normal  rate and regular rhythm.  Pulmonary:     Effort: Pulmonary effort is normal.     Breath sounds: No decreased breath sounds.  Chest:     Chest wall: No tenderness.  Abdominal:     General: There is no distension.     Palpations: Abdomen is soft.  Musculoskeletal:        General: Normal range of motion.     Cervical back: Normal range of motion.     Right lower leg: Edema present.     Left lower leg: Edema present.  Skin:    General: Skin is warm.  Neurological:     General: No focal deficit present.     Mental Status: He is alert and oriented to person, place, and time.  Psychiatric:        Mood and Affect: Mood normal.     UC Treatments / Results  Labs (all labs ordered are listed, but only abnormal results are displayed) Labs Reviewed  CBC WITH DIFFERENTIAL/PLATELET  COMPREHENSIVE METABOLIC PANEL    EKG NS normal EKG    Radiology DG Chest 2 View  Result Date: 06/09/2021 CLINICAL DATA:  Shortness of breath for the past 2 months. EXAM: CHEST - 2 VIEW COMPARISON:  None. FINDINGS: The heart size and mediastinal contours are within normal limits. Both lungs are clear. The visualized skeletal structures are unremarkable. IMPRESSION: No active cardiopulmonary disease. Electronically Signed   By: Titus Dubin M.D.   On: 06/09/2021 15:36    Procedures Procedures (including critical care time)  Medications Ordered in UC Medications - No data to display  Initial Impression / Assessment and Plan / UC Course  I have reviewed the triage vital signs and the nursing notes.  Pertinent labs & imaging results that were available during my care of the patient were reviewed by me and considered in my medical decision making (see chart for details).   MDM:  Chest xray and EKG are normal..  Pt started on hctz for blood pressure and to help with fluid. Cbc and cmet pending Pt given information on primary care to establish   Final Clinical Impressions(s) / UC Diagnoses   Final  diagnoses:  Dyspnea, unspecified type     Discharge Instructions      Schedule appointment with primary care for evaluation    ED Prescriptions     Medication Sig Dispense Auth. Provider   hydrochlorothiazide (HYDRODIURIL) 25 MG tablet  Take 1 tablet (25 mg total) by mouth daily. 30 tablet Aralynn Brake K, [place]   SUMAtriptan (IMITREX) 50 MG tablet Take 1 tablet (50 mg total) by mouth once as needed for migraine. May repeat in 2 hours if headache persists or recurs. 30 tablet Fransico Meadow, [place]      PDMP not reviewed this encounter.   Fransico Meadow, [place] 06/09/21 1717

## 2021-06-09 NOTE — Discharge Instructions (Addendum)
Schedule appointment with primary care for evaluation °

## 2021-06-10 LAB — COMPREHENSIVE METABOLIC PANEL
ALT: 52 IU/L — ABNORMAL HIGH (ref 0–44)
AST: 25 IU/L (ref 0–40)
Albumin/Globulin Ratio: 1.7 (ref 1.2–2.2)
Albumin: 4.4 g/dL (ref 3.8–4.9)
Alkaline Phosphatase: 105 IU/L (ref 44–121)
BUN/Creatinine Ratio: 14 (ref 9–20)
BUN: 17 mg/dL (ref 6–24)
Bilirubin Total: 0.2 mg/dL (ref 0.0–1.2)
CO2: 20 mmol/L (ref 20–29)
Calcium: 9.1 mg/dL (ref 8.7–10.2)
Chloride: 106 mmol/L (ref 96–106)
Creatinine, Ser: 1.22 mg/dL (ref 0.76–1.27)
Globulin, Total: 2.6 g/dL (ref 1.5–4.5)
Glucose: 95 mg/dL (ref 70–99)
Potassium: 4.4 mmol/L (ref 3.5–5.2)
Sodium: 143 mmol/L (ref 134–144)
Total Protein: 7 g/dL (ref 6.0–8.5)
eGFR: 69 mL/min/{1.73_m2} (ref >59)

## 2021-06-10 LAB — CBC WITH DIFFERENTIAL/PLATELET
Basophils Absolute: 0 10*3/uL (ref 0.0–0.2)
Basos: 0 %
EOS (ABSOLUTE): 0.2 10*3/uL (ref 0.0–0.4)
Eos: 2 %
Hematocrit: 46.3 % (ref 37.5–51.0)
Hemoglobin: 16.4 g/dL (ref 13.0–17.7)
Immature Grans (Abs): 0.1 10*3/uL (ref 0.0–0.1)
Immature Granulocytes: 1 %
Lymphocytes Absolute: 3.1 10*3/uL (ref 0.7–3.1)
Lymphs: 31 %
MCH: 31.6 pg (ref 26.6–33.0)
MCHC: 35.4 g/dL (ref 31.5–35.7)
MCV: 89 fL (ref 79–97)
Monocytes Absolute: 0.9 10*3/uL (ref 0.1–0.9)
Monocytes: 9 %
Neutrophils Absolute: 5.6 10*3/uL (ref 1.4–7.0)
Neutrophils: 57 %
Platelets: 301 10*3/uL (ref 150–450)
RBC: 5.19 x10E6/uL (ref 4.14–5.80)
RDW: 12.9 % (ref 11.6–15.4)
WBC: 10 10*3/uL (ref 3.4–10.8)

## 2021-07-15 DIAGNOSIS — I10 Essential (primary) hypertension: Secondary | ICD-10-CM | POA: Diagnosis present

## 2022-03-18 ENCOUNTER — Encounter: Payer: Self-pay | Admitting: Emergency Medicine

## 2022-03-18 ENCOUNTER — Emergency Department
Admission: EM | Admit: 2022-03-18 | Discharge: 2022-03-18 | Disposition: A | Discharge status: Home / Self Care | Payer: 59 | Attending: Emergency Medicine | Admitting: Emergency Medicine

## 2022-03-18 ENCOUNTER — Other Ambulatory Visit: Payer: Self-pay

## 2022-03-18 DIAGNOSIS — R1032 Left lower quadrant pain: Secondary | ICD-10-CM | POA: Insufficient documentation

## 2022-03-18 DIAGNOSIS — R109 Unspecified abdominal pain: Secondary | ICD-10-CM

## 2022-03-18 DIAGNOSIS — Z87442 Personal history of urinary calculi: Secondary | ICD-10-CM | POA: Insufficient documentation

## 2022-03-18 LAB — CBC WITH DIFFERENTIAL/PLATELET
Abs Immature Granulocytes: 0.13 10*3/uL — ABNORMAL HIGH (ref 0.00–0.07)
Basophils Absolute: 0.1 10*3/uL (ref 0.0–0.1)
Basophils Relative: 0 %
Eosinophils Absolute: 0.3 10*3/uL (ref 0.0–0.5)
Eosinophils Relative: 2 %
HCT: 45.5 % (ref 39.0–52.0)
Hemoglobin: 15.7 g/dL (ref 13.0–17.0)
Immature Granulocytes: 1 %
Lymphocytes Relative: 33 %
Lymphs Abs: 4.6 10*3/uL — ABNORMAL HIGH (ref 0.7–4.0)
MCH: 30.6 pg (ref 26.0–34.0)
MCHC: 34.5 g/dL (ref 30.0–36.0)
MCV: 88.7 fL (ref 80.0–100.0)
Monocytes Absolute: 1.4 10*3/uL — ABNORMAL HIGH (ref 0.1–1.0)
Monocytes Relative: 10 %
Neutro Abs: 7.5 10*3/uL (ref 1.7–7.7)
Neutrophils Relative %: 54 %
Platelets: 356 10*3/uL (ref 150–400)
RBC: 5.13 MIL/uL (ref 4.22–5.81)
RDW: 13.1 % (ref 11.5–15.5)
WBC: 14.1 10*3/uL — ABNORMAL HIGH (ref 4.0–10.5)
nRBC: 0 % (ref 0.0–0.2)

## 2022-03-18 LAB — BASIC METABOLIC PANEL
Anion gap: 11 (ref 5–15)
BUN: 28 mg/dL — ABNORMAL HIGH (ref 6–20)
CO2: 26 mmol/L (ref 22–32)
Calcium: 9.2 mg/dL (ref 8.9–10.3)
Chloride: 104 mmol/L (ref 98–111)
Creatinine, Ser: 1.8 mg/dL — ABNORMAL HIGH (ref 0.61–1.24)
GFR, Estimated: 43 mL/min — ABNORMAL LOW (ref >60)
Glucose, Bld: 114 mg/dL — ABNORMAL HIGH (ref 70–99)
Potassium: 3.1 mmol/L — ABNORMAL LOW (ref 3.5–5.1)
Sodium: 141 mmol/L (ref 135–145)

## 2022-03-18 LAB — URINALYSIS, ROUTINE W REFLEX MICROSCOPIC
Bacteria, UA: NONE SEEN
Bilirubin Urine: NEGATIVE
Glucose, UA: NEGATIVE mg/dL
Ketones, ur: NEGATIVE mg/dL
Leukocytes,Ua: NEGATIVE
Nitrite: NEGATIVE
Protein, ur: NEGATIVE mg/dL
RBC / HPF: >50 RBC/hpf — ABNORMAL HIGH (ref 0–5)
Specific Gravity, Urine: 1.019 (ref 1.005–1.030)
pH: 5 (ref 5.0–8.0)

## 2022-03-18 MED ORDER — OXYCODONE-ACETAMINOPHEN 5-325 MG PO TABS: 1.0000 | ORAL_TABLET | ORAL | 0 refills | Status: DC | PRN

## 2022-03-18 MED ORDER — ONDANSETRON 4 MG PO TBDP: 4.0000 mg | ORAL_TABLET | Freq: Three times a day (TID) | ORAL | 0 refills | Status: DC | PRN

## 2022-03-18 MED ORDER — POTASSIUM CHLORIDE CRYS ER 20 MEQ PO TBCR
40.0000 meq | EXTENDED_RELEASE_TABLET | Freq: Once | ORAL | Status: AC
  Administered 2022-03-18: 40 meq via ORAL
  Filled 2022-03-18: qty 2

## 2022-03-18 NOTE — ED Triage Notes (Signed)
Pt ambulatory to triage, appears uncomfortable; c/o left flank pain nonradiating x hr; st hx kidney stones

## 2022-03-18 NOTE — ED Provider Notes (Signed)
[facility] Provider Note    Event Date/Time   First MD Initiated Contact with Patient 03/18/22 0139     (approximate)   History   Chief Complaint Flank Pain   HPI  Sean Rice is a 61 y.o. male with past medical history of migraines and kidney stones who presents to the ED complaining of flank pain.  Patient reports that earlier this evening he had sudden onset severe pain in his left flank radiating to the left lower quadrant of his abdomen.  He describes the pain as similar to prior kidney stones, however it seems to have resolved by the time he arrived to the ED.  He states he feels like the kidney stone "shifted" and he is no longer having any pain.  He states he was feeling nauseous earlier, however this is also resolved.  He has not had any dysuria or noticed any hematuria, denies any fevers.  He has not taken anything for his symptoms prior to arrival.      Physical Exam   Triage Vital Signs: ED Triage Vitals  Enc Vitals Group     BP 03/18/22 0133 (!) 95/53     Pulse Rate 03/18/22 0133 (!) 59     Resp 03/18/22 0133 19     Temp 03/18/22 0133 (!) 97.5 F (36.4 C)     Temp Source 03/18/22 0133 Oral     SpO2 03/18/22 0133 99 %     Weight 03/18/22 0137 240 lb (108.9 kg)     Height 03/18/22 0137 5\' 11"  (1.803 m)     Head Circumference --      Peak Flow --      Pain Score 03/18/22 0136 10     Pain Loc --      Pain Edu? --      Excl. in [place]? --     Most recent vital signs: Vitals:   03/18/22 0133 03/18/22 0201  BP: (!) 95/53 102/69  Pulse: (!) 59 65  Resp: 19 18  Temp: (!) 97.5 F (36.4 C) 98 F (36.7 C)  SpO2: 99% 96%    Constitutional: Alert and oriented. Eyes: Conjunctivae are normal. Head: Atraumatic. Nose: No congestion/rhinnorhea. Mouth/Throat: Mucous membranes are moist.  Cardiovascular: Normal rate, regular rhythm. Grossly normal heart sounds.  2+ radial pulses bilaterally. Respiratory: Normal respiratory effort.  No  retractions. Lungs CTAB. Gastrointestinal: Soft and nontender.  No CVA tenderness bilaterally.  No distention. Musculoskeletal: No lower extremity tenderness nor edema.  Neurologic:  Normal speech and language. No gross focal neurologic deficits are appreciated.    ED Results / Procedures / Treatments   Labs (all labs ordered are listed, but only abnormal results are displayed) Labs Reviewed  CBC WITH DIFFERENTIAL/PLATELET - Abnormal; Notable for the following components:      Result Value   WBC 14.1 (*)    Lymphs Abs 4.6 (*)    Monocytes Absolute 1.4 (*)    Abs Immature Granulocytes 0.13 (*)    All other components within normal limits  BASIC METABOLIC PANEL - Abnormal; Notable for the following components:   Potassium 3.1 (*)    Glucose, Bld 114 (*)    BUN 28 (*)    Creatinine, Ser 1.80 (*)    GFR, Estimated 43 (*)    All other components within normal limits  URINALYSIS, ROUTINE W REFLEX MICROSCOPIC - Abnormal; Notable for the following components:   Color, Urine YELLOW (*)    APPearance HAZY (*)  Hgb urine dipstick LARGE (*)    RBC / HPF >50 (*)    All other components within normal limits     PROCEDURES:  Critical Care performed: No  Procedures   MEDICATIONS ORDERED IN ED: Medications  potassium chloride SA (KLOR-CON M) CR tablet 40 mEq (has no administration in time range)     IMPRESSION / MDM / ASSESSMENT AND PLAN / ED COURSE  I reviewed the triage vital signs and the nursing notes.                              61 y.o. male with past medical history of migraine and kidney stones who presents to the ED complaining of sudden onset severe flank pain with nausea that has since resolved.  Patient's presentation is most consistent with acute presentation with potential threat to life or bodily function.  Differential diagnosis includes, but is not limited to, kidney stone, pyelonephritis, cystitis, diverticulitis.  Patient well-appearing and in no acute  distress, vital signs are unremarkable.  He states his pain has completely resolved at the time of my assessment and he has benign abdominal exam with no CVA tenderness.  Patient reports long history of kidney stones with symptoms today being exactly the same as prior episodes.  Urinalysis shows hematuria consistent with this but no signs of infection.  Given resolution of his pain, do not feel CT imaging is indicated at this time but he would benefit from outpatient neurology follow-up as needed.  Labs unremarkable for mild AKI, patient states that he has dealt with significant fluctuations in kidney function "all my life."  We will give dose of supplemental potassium for his hypokalemia but he was counseled to follow-up with his PCP for recheck of kidney function and to drink plenty of fluids. CBC shows mild leukocytosis with no anemia.  Patient counseled to return to the ED for new or worsening symptoms, otherwise follow-up with his PCP for recheck of kidney function.  Patient agrees with plan.       FINAL CLINICAL IMPRESSION(S) / ED DIAGNOSES   Final diagnoses:  Flank pain  History of kidney stones     Rx / DC Orders   ED Discharge Orders          Ordered    oxyCODONE-acetaminophen (PERCOCET) 5-325 MG tablet  Every 4 hours PRN        03/18/22 0400    ondansetron (ZOFRAN-ODT) 4 MG disintegrating tablet  Every 8 hours PRN        03/18/22 0400             Note:  This document was prepared using Dragon voice recognition software and may include unintentional dictation errors.   Chesley Noon, MD 03/18/22 [phone]

## 2022-03-20 ENCOUNTER — Observation Stay: Payer: 59

## 2022-03-20 ENCOUNTER — Other Ambulatory Visit: Payer: Self-pay

## 2022-03-20 ENCOUNTER — Observation Stay
Admission: EM | Admit: 2022-03-20 | Discharge: 2022-03-20 | Disposition: A | Discharge status: Home / Self Care | Payer: 59 | Attending: Emergency Medicine | Admitting: Emergency Medicine

## 2022-03-20 ENCOUNTER — Encounter: Payer: Self-pay | Admitting: Emergency Medicine

## 2022-03-20 ENCOUNTER — Emergency Department: Payer: 59

## 2022-03-20 ENCOUNTER — Observation Stay: Payer: 59 | Admitting: Anesthesiology

## 2022-03-20 ENCOUNTER — Encounter
Admission: EM | Disposition: A | Discharge status: Home / Self Care | Payer: Self-pay | Source: Home / Self Care | Attending: Emergency Medicine

## 2022-03-20 DIAGNOSIS — N202 Calculus of kidney with calculus of ureter: Secondary | ICD-10-CM | POA: Diagnosis not present

## 2022-03-20 DIAGNOSIS — Z960 Presence of urogenital implants: Secondary | ICD-10-CM | POA: Diagnosis not present

## 2022-03-20 DIAGNOSIS — N201 Calculus of ureter: Secondary | ICD-10-CM

## 2022-03-20 DIAGNOSIS — N23 Unspecified renal colic: Secondary | ICD-10-CM | POA: Diagnosis not present

## 2022-03-20 DIAGNOSIS — N132 Hydronephrosis with renal and ureteral calculous obstruction: Secondary | ICD-10-CM | POA: Diagnosis not present

## 2022-03-20 DIAGNOSIS — R911 Solitary pulmonary nodule: Secondary | ICD-10-CM | POA: Insufficient documentation

## 2022-03-20 DIAGNOSIS — I10 Essential (primary) hypertension: Secondary | ICD-10-CM | POA: Insufficient documentation

## 2022-03-20 DIAGNOSIS — N200 Calculus of kidney: Secondary | ICD-10-CM

## 2022-03-20 DIAGNOSIS — F17210 Nicotine dependence, cigarettes, uncomplicated: Secondary | ICD-10-CM | POA: Insufficient documentation

## 2022-03-20 DIAGNOSIS — N179 Acute kidney failure, unspecified: Secondary | ICD-10-CM | POA: Insufficient documentation

## 2022-03-20 DIAGNOSIS — Z79899 Other long term (current) drug therapy: Secondary | ICD-10-CM | POA: Diagnosis not present

## 2022-03-20 DIAGNOSIS — N139 Obstructive and reflux uropathy, unspecified: Secondary | ICD-10-CM

## 2022-03-20 DIAGNOSIS — R109 Unspecified abdominal pain: Secondary | ICD-10-CM | POA: Diagnosis present

## 2022-03-20 HISTORY — PX: CYSTOSCOPY/URETEROSCOPY/HOLMIUM LASER/STENT PLACEMENT: SHX6546

## 2022-03-20 LAB — URINALYSIS, ROUTINE W REFLEX MICROSCOPIC
Bacteria, UA: NONE SEEN
Bilirubin Urine: NEGATIVE
Glucose, UA: NEGATIVE mg/dL
Ketones, ur: NEGATIVE mg/dL
Leukocytes,Ua: NEGATIVE
Nitrite: NEGATIVE
Protein, ur: NEGATIVE mg/dL
Specific Gravity, Urine: 1.019 (ref 1.005–1.030)
Squamous Epithelial / HPF: NONE SEEN (ref 0–5)
pH: 5 (ref 5.0–8.0)

## 2022-03-20 LAB — CBC WITH DIFFERENTIAL/PLATELET
Abs Immature Granulocytes: 0.15 10*3/uL — ABNORMAL HIGH (ref 0.00–0.07)
Basophils Absolute: 0.1 10*3/uL (ref 0.0–0.1)
Basophils Relative: 0 %
Eosinophils Absolute: 0.5 10*3/uL (ref 0.0–0.5)
Eosinophils Relative: 4 %
HCT: 43.4 % (ref 39.0–52.0)
Hemoglobin: 15.2 g/dL (ref 13.0–17.0)
Immature Granulocytes: 1 %
Lymphocytes Relative: 28 %
Lymphs Abs: 3.9 10*3/uL (ref 0.7–4.0)
MCH: 31.2 pg (ref 26.0–34.0)
MCHC: 35 g/dL (ref 30.0–36.0)
MCV: 89.1 fL (ref 80.0–100.0)
Monocytes Absolute: 1.3 10*3/uL — ABNORMAL HIGH (ref 0.1–1.0)
Monocytes Relative: 9 %
Neutro Abs: 8.4 10*3/uL — ABNORMAL HIGH (ref 1.7–7.7)
Neutrophils Relative %: 58 %
Platelets: 318 10*3/uL (ref 150–400)
RBC: 4.87 MIL/uL (ref 4.22–5.81)
RDW: 13 % (ref 11.5–15.5)
WBC: 14.3 10*3/uL — ABNORMAL HIGH (ref 4.0–10.5)
nRBC: 0 % (ref 0.0–0.2)

## 2022-03-20 LAB — COMPREHENSIVE METABOLIC PANEL
ALT: 35 U/L (ref 0–44)
AST: 19 U/L (ref 15–41)
Albumin: 4 g/dL (ref 3.5–5.0)
Alkaline Phosphatase: 70 U/L (ref 38–126)
Anion gap: 9 (ref 5–15)
BUN: 29 mg/dL — ABNORMAL HIGH (ref 6–20)
CO2: 27 mmol/L (ref 22–32)
Calcium: 9 mg/dL (ref 8.9–10.3)
Chloride: 100 mmol/L (ref 98–111)
Creatinine, Ser: 2.72 mg/dL — ABNORMAL HIGH (ref 0.61–1.24)
GFR, Estimated: 26 mL/min — ABNORMAL LOW (ref >60)
Glucose, Bld: 108 mg/dL — ABNORMAL HIGH (ref 70–99)
Potassium: 3.3 mmol/L — ABNORMAL LOW (ref 3.5–5.1)
Sodium: 136 mmol/L (ref 135–145)
Total Bilirubin: 0.7 mg/dL (ref 0.3–1.2)
Total Protein: 7.2 g/dL (ref 6.5–8.1)

## 2022-03-20 LAB — HIV ANTIBODY (ROUTINE TESTING W REFLEX): HIV Screen 4th Generation wRfx: NONREACTIVE

## 2022-03-20 SURGERY — CYSTOSCOPY/URETEROSCOPY/HOLMIUM LASER/STENT PLACEMENT
Anesthesia: General | Site: Ureter | Laterality: Bilateral

## 2022-03-20 MED ORDER — OXYCODONE HCL 5 MG/5ML PO SOLN: 5.0000 mg | Freq: Once | ORAL | Status: DC | PRN

## 2022-03-20 MED ORDER — SODIUM CHLORIDE 0.9 % IV BOLUS
1000.0000 mL | Freq: Once | INTRAVENOUS | Status: AC
  Administered 2022-03-20: 1000 mL via INTRAVENOUS

## 2022-03-20 MED ORDER — TAMSULOSIN HCL 0.4 MG PO CAPS: 0.4000 mg | ORAL_CAPSULE | Freq: Every day | ORAL | 0 refills | Status: AC

## 2022-03-20 MED ORDER — FENTANYL CITRATE (PF) 100 MCG/2ML IJ SOLN
INTRAMUSCULAR | Status: DC | PRN
  Administered 2022-03-20 (×2): 25 ug via INTRAVENOUS
  Administered 2022-03-20: 50 ug via INTRAVENOUS

## 2022-03-20 MED ORDER — POTASSIUM CHLORIDE CRYS ER 20 MEQ PO TBCR: 40.0000 meq | EXTENDED_RELEASE_TABLET | Freq: Once | ORAL | Status: DC

## 2022-03-20 MED ORDER — ACETAMINOPHEN 650 MG RE SUPP: 650.0000 mg | Freq: Four times a day (QID) | RECTAL | Status: DC | PRN

## 2022-03-20 MED ORDER — CHLORHEXIDINE GLUCONATE 0.12 % MT SOLN: 15.0000 mL | Freq: Once | OROMUCOSAL | Status: AC

## 2022-03-20 MED ORDER — SUGAMMADEX SODIUM 500 MG/5ML IV SOLN
INTRAVENOUS | Status: DC | PRN
  Administered 2022-03-20: 300 mg via INTRAVENOUS

## 2022-03-20 MED ORDER — ORAL CARE MOUTH RINSE: 15.0000 mL | Freq: Once | OROMUCOSAL | Status: AC

## 2022-03-20 MED ORDER — ONDANSETRON HCL 4 MG/2ML IJ SOLN
INTRAMUSCULAR | Status: DC | PRN
  Administered 2022-03-20: 4 mg via INTRAVENOUS

## 2022-03-20 MED ORDER — HYDROCHLOROTHIAZIDE 25 MG PO TABS: 25.0000 mg | ORAL_TABLET | Freq: Every day | ORAL | Status: DC

## 2022-03-20 MED ORDER — ONDANSETRON HCL 4 MG/2ML IJ SOLN
4.0000 mg | Freq: Once | INTRAMUSCULAR | Status: AC
  Administered 2022-03-20: 4 mg via INTRAVENOUS
  Filled 2022-03-20: qty 2

## 2022-03-20 MED ORDER — CEFAZOLIN SODIUM-DEXTROSE 2-3 GM-%(50ML) IV SOLR
INTRAVENOUS | Status: DC | PRN
  Administered 2022-03-20: 2 g via INTRAVENOUS

## 2022-03-20 MED ORDER — PROPOFOL 10 MG/ML IV BOLUS
INTRAVENOUS | Status: DC | PRN
  Administered 2022-03-20: 200 mg via INTRAVENOUS

## 2022-03-20 MED ORDER — ACETAMINOPHEN 10 MG/ML IV SOLN: 1000.0000 mg | Freq: Once | INTRAVENOUS | Status: DC | PRN

## 2022-03-20 MED ORDER — MIDAZOLAM HCL 2 MG/2ML IJ SOLN
INTRAMUSCULAR | Status: AC
  Filled 2022-03-20: qty 2

## 2022-03-20 MED ORDER — FENTANYL CITRATE (PF) 100 MCG/2ML IJ SOLN
INTRAMUSCULAR | Status: AC
  Filled 2022-03-20: qty 2

## 2022-03-20 MED ORDER — IOHEXOL 180 MG/ML  SOLN
INTRAMUSCULAR | Status: DC | PRN
  Administered 2022-03-20: 20 mL

## 2022-03-20 MED ORDER — PHENYLEPHRINE HCL-NACL 20-0.9 MG/250ML-% IV SOLN
INTRAVENOUS | Status: DC | PRN
  Administered 2022-03-20: 50 ug/min via INTRAVENOUS

## 2022-03-20 MED ORDER — LIDOCAINE HCL (CARDIAC) PF 100 MG/5ML IV SOSY
PREFILLED_SYRINGE | INTRAVENOUS | Status: DC | PRN
  Administered 2022-03-20: 100 mg via INTRAVENOUS

## 2022-03-20 MED ORDER — PHENYLEPHRINE HCL (PRESSORS) 10 MG/ML IV SOLN
INTRAVENOUS | Status: AC
  Filled 2022-03-20: qty 1

## 2022-03-20 MED ORDER — ONDANSETRON HCL 4 MG PO TABS: 4.0000 mg | ORAL_TABLET | Freq: Four times a day (QID) | ORAL | Status: DC | PRN

## 2022-03-20 MED ORDER — ROCURONIUM BROMIDE 100 MG/10ML IV SOLN
INTRAVENOUS | Status: DC | PRN
  Administered 2022-03-20: 30 mg via INTRAVENOUS
  Administered 2022-03-20: 70 mg via INTRAVENOUS

## 2022-03-20 MED ORDER — DEXAMETHASONE SODIUM PHOSPHATE 10 MG/ML IJ SOLN
INTRAMUSCULAR | Status: DC | PRN
  Administered 2022-03-20: 10 mg via INTRAVENOUS

## 2022-03-20 MED ORDER — HYDROMORPHONE HCL 1 MG/ML IJ SOLN
1.0000 mg | INTRAMUSCULAR | Status: DC | PRN
  Administered 2022-03-20: 1 mg via INTRAVENOUS
  Filled 2022-03-20: qty 1

## 2022-03-20 MED ORDER — FENTANYL CITRATE (PF) 100 MCG/2ML IJ SOLN: 25.0000 ug | INTRAMUSCULAR | Status: DC | PRN

## 2022-03-20 MED ORDER — ONDANSETRON HCL 4 MG/2ML IJ SOLN: 4.0000 mg | Freq: Once | INTRAMUSCULAR | Status: DC | PRN

## 2022-03-20 MED ORDER — CEFAZOLIN SODIUM-DEXTROSE 2-4 GM/100ML-% IV SOLN
INTRAVENOUS | Status: AC
  Filled 2022-03-20: qty 100

## 2022-03-20 MED ORDER — HYDROCODONE-ACETAMINOPHEN 5-325 MG PO TABS
1.0000 | ORAL_TABLET | ORAL | Status: DC | PRN
  Administered 2022-03-20: 2 via ORAL
  Filled 2022-03-20: qty 2

## 2022-03-20 MED ORDER — MORPHINE SULFATE (PF) 4 MG/ML IV SOLN
4.0000 mg | Freq: Once | INTRAVENOUS | Status: AC
  Administered 2022-03-20: 4 mg via INTRAVENOUS
  Filled 2022-03-20: qty 1

## 2022-03-20 MED ORDER — KETOROLAC TROMETHAMINE 15 MG/ML IJ SOLN: 15.0000 mg | Freq: Once | INTRAMUSCULAR | Status: DC

## 2022-03-20 MED ORDER — CHLORHEXIDINE GLUCONATE 0.12 % MT SOLN
OROMUCOSAL | Status: AC
  Administered 2022-03-20: 15 mL via OROMUCOSAL
  Filled 2022-03-20: qty 15

## 2022-03-20 MED ORDER — KETOROLAC TROMETHAMINE 30 MG/ML IJ SOLN
30.0000 mg | Freq: Four times a day (QID) | INTRAMUSCULAR | Status: DC | PRN
  Administered 2022-03-20: 30 mg via INTRAVENOUS
  Filled 2022-03-20: qty 1

## 2022-03-20 MED ORDER — ACETAMINOPHEN 325 MG PO TABS: 650.0000 mg | ORAL_TABLET | Freq: Four times a day (QID) | ORAL | Status: DC | PRN

## 2022-03-20 MED ORDER — ONDANSETRON HCL 4 MG/2ML IJ SOLN: 4.0000 mg | Freq: Four times a day (QID) | INTRAMUSCULAR | Status: DC | PRN

## 2022-03-20 MED ORDER — SODIUM CHLORIDE 0.9 % IR SOLN
Status: DC | PRN
  Administered 2022-03-20: 3000 mL via INTRAVESICAL

## 2022-03-20 MED ORDER — LACTATED RINGERS IV SOLN: INTRAVENOUS | Status: DC

## 2022-03-20 MED ORDER — OXYCODONE HCL 5 MG PO TABS: 5.0000 mg | ORAL_TABLET | Freq: Once | ORAL | Status: DC | PRN

## 2022-03-20 MED ORDER — TAMSULOSIN HCL 0.4 MG PO CAPS
0.4000 mg | ORAL_CAPSULE | Freq: Every day | ORAL | Status: DC
  Administered 2022-03-20: 0.4 mg via ORAL
  Filled 2022-03-20: qty 1

## 2022-03-20 MED ORDER — MIDAZOLAM HCL 2 MG/2ML IJ SOLN
INTRAMUSCULAR | Status: DC | PRN
  Administered 2022-03-20: 2 mg via INTRAVENOUS

## 2022-03-20 MED ORDER — SODIUM CHLORIDE 0.9 % IV SOLN: INTRAVENOUS | Status: DC

## 2022-03-20 SURGICAL SUPPLY — 32 items
ADH LQ OCL WTPRF AMP STRL LF (MISCELLANEOUS)
ADHESIVE MASTISOL STRL (MISCELLANEOUS) IMPLANT
BAG DRAIN SIEMENS DORNER NS (MISCELLANEOUS) ×1 IMPLANT
BAG DRN NS LF (MISCELLANEOUS) ×1
BAG PRESSURE INF REUSE 3000 (BAG) ×1 IMPLANT
BRUSH SCRUB EZ 1% IODOPHOR (MISCELLANEOUS) ×1 IMPLANT
CATH URET FLEX-TIP 2 LUMEN 10F (CATHETERS) IMPLANT
CATH URETL OPEN 5X70 (CATHETERS) IMPLANT
CNTNR SPEC 2.5X3XGRAD LEK (MISCELLANEOUS)
CONT SPEC 4OZ STER OR WHT (MISCELLANEOUS)
CONT SPEC 4OZ STRL OR WHT (MISCELLANEOUS)
CONTAINER SPEC 2.5X3XGRAD LEK (MISCELLANEOUS) IMPLANT
DRAPE UTILITY 15X26 TOWEL STRL (DRAPES) ×1 IMPLANT
DRSG TEGADERM 2-3/8X2-3/4 SM (GAUZE/BANDAGES/DRESSINGS) IMPLANT
FIBER LASER MOSES 200 DFL (Laser) IMPLANT
GLOVE SURG UNDER POLY LF SZ7.5 (GLOVE) ×1 IMPLANT
GOWN STRL REUS W/ TWL LRG LVL3 (GOWN DISPOSABLE) ×1 IMPLANT
GOWN STRL REUS W/ TWL XL LVL3 (GOWN DISPOSABLE) ×1 IMPLANT
GOWN STRL REUS W/TWL LRG LVL3 (GOWN DISPOSABLE) ×1
GOWN STRL REUS W/TWL XL LVL3 (GOWN DISPOSABLE) ×1
GUIDEWIRE STR DUAL SENSOR (WIRE) ×1 IMPLANT
IV NS IRRIG 3000ML ARTHROMATIC (IV SOLUTION) ×1 IMPLANT
KIT TURNOVER CYSTO (KITS) ×1 IMPLANT
PACK CYSTO AR (MISCELLANEOUS) ×1 IMPLANT
SET CYSTO W/LG BORE CLAMP LF (SET/KITS/TRAYS/PACK) ×1 IMPLANT
SHEATH NAVIGATOR HD 12/14X36 (SHEATH) IMPLANT
STENT URET 6FRX24 CONTOUR (STENTS) IMPLANT
STENT URET 6FRX26 CONTOUR (STENTS) IMPLANT
SURGILUBE 2OZ TUBE FLIPTOP (MISCELLANEOUS) ×1 IMPLANT
SYR 10ML LL (SYRINGE) ×1 IMPLANT
VALVE UROSEAL ADJ ENDO (VALVE) IMPLANT
WATER STERILE IRR 1000ML POUR (IV SOLUTION) IMPLANT

## 2022-03-20 NOTE — Anesthesia Procedure Notes (Signed)
Procedure Name: Intubation Date/Time: 03/20/2022 12:58 PM  Performed by: Nelda Marseille, CRNAPre-anesthesia Checklist: Patient identified, Patient being monitored, Timeout performed, Emergency Drugs available and Suction available Patient Re-evaluated:Patient Re-evaluated prior to induction Oxygen Delivery Method: Circle System Utilized Preoxygenation: Pre-oxygenation with 100% oxygen Induction Type: IV induction Ventilation: Mask ventilation without difficulty Laryngoscope Size: Mac, 3 and McGraph Grade View: Grade I Tube type: Oral Tube size: 7.5 mm Number of attempts: 1 Airway Equipment and Method: Stylet and Oral airway Placement Confirmation: ETT inserted through vocal cords under direct vision, positive ETCO2 and breath sounds checked- equal and bilateral Secured at: 21 cm Tube secured with: Tape Dental Injury: Teeth and Oropharynx as per pre-operative assessment  Difficulty Due To: Difficulty was unanticipated

## 2022-03-20 NOTE — H&P (Signed)
History and Physical    PatientMarland Kitchen [name] NFA:[id number] DOB: December 31, 1960 DOA: 03/20/2022 DOS: the patient was seen and examined on 03/20/2022 PCP: Mick Sell, MD  Patient coming from: Home  Chief Complaint:  Chief Complaint  Patient presents with   Flank Pain    HPI: Sean Rice is a 61 y.o. male with medical history significant for Hypertension and nephrolithiasis with prior history of lithotripsy who presents to the ED with left-sided flank pain similar to prior episodes of kidney stones.  He denies fever or chills or dysuria.  Did vomit a couple times. ED course and data review: Vitals within normal limits.  Creatinine 2.72 up from a baseline of 1 and WBC 14,000.  Urinalysis with moderate hemoglobin only. CT renal stone study showed the following: IMPRESSION: 1. 7 mm left UVJ renal calculus with mild left-sided hydronephrosis, hydroureter and perinephric inflammatory fat stranding. 2. 18 mm renal calculus within the right renal pelvis. 3. Stable bilateral renal cysts. No follow-up imaging is recommended. This recommendation follows ACR consensus guidelines: Management of the Incidental Renal Mass on CT: A White Paper of the ACR Incidental Findings Committee. J Am Coll Radiol 2018;15:264-273. 4. 12 mm noncalcified lung nodule within the posteromedial aspect of the right lung base (measured 10 mm on the prior exam). Further evaluation with complete nonemergent chest CT is recommended. 5. Aortic atherosclerosis.  Patient was given an IV fluid bolus, morphine and Zofran and hospitalist consulted for admission.   Review of Systems: As mentioned in the history of present illness. All other systems reviewed and are negative.  Past Medical History:  Diagnosis Date   Kidney stones    Migraine    Past Surgical History:  Procedure Laterality Date   CYSTOSCOPY/URETEROSCOPY/HOLMIUM LASER/STENT PLACEMENT Left 11/25/2017   Procedure: CYSTOSCOPY/URETEROSCOPY/HOLMIUM  LASER/STENT PLACEMENT;  Surgeon: Vanna Scotland, MD;  Location: [facility]S;  Service: Urology;  Laterality: Left;   EXTRACORPOREAL SHOCK WAVE LITHOTRIPSY Left 11/25/2017   Procedure: EXTRACORPOREAL SHOCK WAVE LITHOTRIPSY (ESWL);  Surgeon: Vanna Scotland, MD;  Location: [facility] OR[facility];  Service: Urology;  Laterality: Left;   Social History:  reports that he has been smoking cigarettes. He has a 0.13 pack-year smoking history. He has never used smokeless tobacco. He reports that he does not drink alcohol and does not use drugs.  No Known Allergies  No family history on file.  Prior to Admission medications   Medication Sig Start Date End Date Taking? Authorizing Provider  cyclobenzaprine (FLEXERIL) 10 MG tablet Take 1 tablet (10 mg total) by mouth 2 (two) times daily as needed for muscle spasms. 10/03/20   Mickie Bail, NP  hydrochlorothiazide (HYDRODIURIL) 25 MG tablet Take 1 tablet (25 mg total) by mouth daily. 06/09/21   Elson Areas, PA-C  ondansetron (ZOFRAN-ODT) 4 MG disintegrating tablet Take 1 tablet (4 mg total) by mouth every 8 (eight) hours as needed for nausea or vomiting. 03/18/22   Chesley Noon, MD  oxyCODONE-acetaminophen (PERCOCET) 5-325 MG tablet Take 1 tablet by mouth every 4 (four) hours as needed for severe pain. 03/18/22 03/18/23  Chesley Noon, MD  predniSONE (STERAPRED UNI-PAK 21 TAB) 10 MG (21) TBPK tablet Take 6 tablets on the first day and decrease by 1 tablet each day until finished. 10/07/20   Triplett, Rulon Eisenmenger B, FNP  SUMAtriptan (IMITREX) 50 MG tablet Take 1 tablet (50 mg total) by mouth once as needed for migraine. May repeat in 2 hours if headache persists or recurs. 06/09/21 06/10/22  Elson Areas, PA-C  Physical Exam: Vitals:   03/20/22 0035 03/20/22 0108  BP:  111/72  Pulse:  69  Resp:  18  Temp:  98 F (36.7 C)  TempSrc:  Oral  SpO2:  96%  Weight: 108 kg   Height: 6\' 2"  (1.88 m)    Physical Exam Vitals and nursing note reviewed.  Constitutional:       General: He is not in acute distress. HENT:     Head: Normocephalic and atraumatic.  Cardiovascular:     Rate and Rhythm: Normal rate and regular rhythm.     Heart sounds: Normal heart sounds.  Pulmonary:     Effort: Pulmonary effort is normal.     Breath sounds: Normal breath sounds.  Abdominal:     Palpations: Abdomen is soft.     Tenderness: There is no abdominal tenderness. There is left CVA tenderness.  Neurological:     Mental Status: Mental status is at baseline.     Labs on Admission: I have personally reviewed following labs and imaging studies  CBC: Recent Labs  Lab 03/18/22 0150 03/20/22 0105  WBC 14.1* 14.3*  NEUTROABS 7.5 8.4*  HGB 15.7 15.2  HCT 45.5 43.4  MCV 88.7 89.1  PLT 356 318   Basic Metabolic Panel: Recent Labs  Lab 03/18/22 0150 03/20/22 0105  NA 141 136  K 3.1* 3.3*  CL 104 100  CO2 26 27  GLUCOSE 114* 108*  BUN 28* 29*  CREATININE 1.80* 2.72*  CALCIUM 9.2 9.0   GFR: Estimated Creatinine Clearance: 38.3 mL/min (A) (by C-G formula based on SCr of 2.72 mg/dL (H)). Liver Function Tests: Recent Labs  Lab 03/20/22 0105  AST 19  ALT 35  ALKPHOS 70  BILITOT 0.7  PROT 7.2  ALBUMIN 4.0   No results for input(s): "LIPASE", "AMYLASE" in the last 168 hours. No results for input(s): "AMMONIA" in the last 168 hours. Coagulation Profile: No results for input(s): "INR", "PROTIME" in the last 168 hours. Cardiac Enzymes: No results for input(s): "CKTOTAL", "CKMB", "CKMBINDEX", "TROPONINI" in the last 168 hours. BNP (last 3 results) No results for input(s): "PROBNP" in the last 8760 hours. HbA1C: No results for input(s): "HGBA1C" in the last 72 hours. CBG: No results for input(s): "GLUCAP" in the last 168 hours. Lipid Profile: No results for input(s): "CHOL", "HDL", "LDLCALC", "TRIG", "CHOLHDL", "LDLDIRECT" in the last 72 hours. Thyroid Function Tests: No results for input(s): "TSH", "T4TOTAL", "FREET4", "T3FREE", "THYROIDAB" in the last  72 hours. Anemia Panel: No results for input(s): "VITAMINB12", "FOLATE", "FERRITIN", "TIBC", "IRON", "RETICCTPCT" in the last 72 hours. Urine analysis:    Component Value Date/Time   COLORURINE YELLOW (A) 03/20/2022 0105   APPEARANCEUR CLEAR (A) 03/20/2022 0105   LABSPEC 1.019 03/20/2022 0105   PHURINE 5.0 03/20/2022 0105   GLUCOSEU NEGATIVE 03/20/2022 0105   HGBUR MODERATE (A) 03/20/2022 0105   BILIRUBINUR NEGATIVE 03/20/2022 0105   KETONESUR NEGATIVE 03/20/2022 0105   PROTEINUR NEGATIVE 03/20/2022 0105   NITRITE NEGATIVE 03/20/2022 0105   LEUKOCYTESUR NEGATIVE 03/20/2022 0105    Radiological Exams on Admission: CT Renal Stone Study  Result Date: 03/20/2022 CLINICAL DATA:  Left flank pain. EXAM: CT ABDOMEN AND PELVIS WITHOUT CONTRAST TECHNIQUE: Multidetector CT imaging of the abdomen and pelvis was performed following the standard protocol without IV contrast. RADIATION DOSE REDUCTION: This exam was performed according to the departmental dose-optimization program which includes automated exposure control, adjustment of the mA and/or kV according to patient size and/or use of iterative reconstruction technique. COMPARISON:  Oct 07, 2020 FINDINGS: Lower chest: A 12 mm noncalcified lung nodule is seen within the posteromedial aspect of the right lung base (measured 10 mm on the prior exam). Hepatobiliary: No focal liver abnormality is seen. No gallstones, gallbladder wall thickening, or biliary dilatation. Pancreas: Unremarkable. No pancreatic ductal dilatation or surrounding inflammatory changes. Spleen: Normal in size without focal abnormality. Adrenals/Urinary Tract: Adrenal glands are unremarkable. Kidneys are normal in size. Stable bilateral renal cysts are noted. An 18 mm renal calculus is seen within the right renal pelvis. A 7 mm renal calculus is seen at the left UVJ, with mild left-sided hydronephrosis, hydroureter and perinephric inflammatory fat stranding. An additional 5 mm renal  calculus is seen within the urinary bladder on the left, near the left UVJ. This is present on the prior study. Bladder is unremarkable. Stomach/Bowel: Stomach is within normal limits. Appendix appears normal. No evidence of bowel wall thickening, distention, or inflammatory changes. Vascular/Lymphatic: Aortic atherosclerosis. No enlarged abdominal or pelvic lymph nodes. Reproductive: Prostate is unremarkable. Other: No abdominal wall hernia or abnormality. No abdominopelvic ascites. Musculoskeletal: No acute or significant osseous findings. IMPRESSION: 1. 7 mm left UVJ renal calculus with mild left-sided hydronephrosis, hydroureter and perinephric inflammatory fat stranding. 2. 18 mm renal calculus within the right renal pelvis. 3. Stable bilateral renal cysts. No follow-up imaging is recommended. This recommendation follows ACR consensus guidelines: Management of the Incidental Renal Mass on CT: A White Paper of the ACR Incidental Findings Committee. J Am Coll Radiol 2018;15:264-273. 4. 12 mm noncalcified lung nodule within the posteromedial aspect of the right lung base (measured 10 mm on the prior exam). Further evaluation with complete nonemergent chest CT is recommended. 5. Aortic atherosclerosis. Aortic Atherosclerosis (ICD10-I70.0). Electronically Signed   By: Aram Candela M.D.   On: 03/20/2022 01:09     Data Reviewed: Relevant notes from primary care and specialist visits, past discharge summaries as available in EHR, including Care Everywhere. Prior diagnostic testing as pertinent to current admission diagnoses Updated medications and problem lists for reconciliation ED course, including vitals, labs, imaging, treatment and response to treatment Triage notes, nursing and pharmacy notes and ED provider's notes Notable results as noted in HPI   Assessment and Plan: * Hydronephrosis with urinary obstruction due to ureteral calculus AKI secondary to obstructive uropathy IV  fluids Flomax Pain control Urology consult   HTN (hypertension), benign Continue HCTZ        DVT prophylaxis: low risk  Consults: Urology, Dr. Georgeanna Lea  Advance Care Planning: Full code  Family Communication: none  Disposition Plan: Back to previous home environment  Severity of Illness: The appropriate patient status for this patient is OBSERVATION. Observation status is judged to be reasonable and necessary in order to provide the required intensity of service to ensure the patient's safety. The patient's presenting symptoms, physical exam findings, and initial radiographic and laboratory data in the context of their medical condition is felt to place them at decreased risk for further clinical deterioration. Furthermore, it is anticipated that the patient will be medically stable for discharge from the hospital within 2 midnights of admission.   Author: Andris Baumann, MD 03/20/2022 3:29 AM  For on call review [email or web address].

## 2022-03-20 NOTE — Assessment & Plan Note (Signed)
AKI secondary to obstructive uropathy IV fluids Flomax Pain control Urology consult

## 2022-03-20 NOTE — Assessment & Plan Note (Signed)
Continue HCTZ

## 2022-03-20 NOTE — Progress Notes (Signed)
Patient is alert and oriented x4. Up ad lib. Decreased appetite x1 week. Had dilaudid and toradol for pain. Gave strainer and urinal. Refused nicotine patch but admitted that he smokes 2 packs  of cigarettes a day. Denied additional needs.

## 2022-03-20 NOTE — ED Triage Notes (Signed)
Patient ambulatory to triage with steady gait, without difficulty or distress noted; pt reports seen 2 days ago for kidney stone; rx percocet (last ds at 7pm) but pain persists to left flank, nonradiating

## 2022-03-20 NOTE — Interval H&P Note (Signed)
History and Physical Interval Note:  03/20/2022 12:26 PM  Sean Rice  has presented today for surgery, with the diagnosis of Bilateral ureteral stone.  The various methods of treatment have been discussed with the patient and family. After consideration of risks, benefits and other options for treatment, the patient has consented to  Procedure(s): CYSTOSCOPY/URETEROSCOPY/HOLMIUM LASER/STENT PLACEMENT (Bilateral) as a surgical intervention.  The patient's history has been reviewed, patient examined, no change in status, stable for surgery.  I have reviewed the patient's chart and labs.  Questions were answered to the patient's satisfaction.     Sondra Come

## 2022-03-20 NOTE — Op Note (Signed)
Date of procedure: 03/20/22  Preoperative diagnosis:  Left distal ureteral stone Right renal stone Acute kidney injury  Postoperative diagnosis:  Same  Procedure: Cystoscopy Left retrograde pyelogram with intraoperative interpretation Right retrograde pyelogram with intraoperative interpretation Left ureteroscopy, laser lithotripsy, stent placement Right ureteroscopy, laser lithotripsy, stent placement  Surgeon: Legrand Rams, MD  Anesthesia: General  Complications: None  Intraoperative findings:  Small prostate, normal bladder, large ureteral stone crowning at the left ureteral orifice, dusted and all fragments irrigated free Large right renal stone, dusted to fine fragments Uncomplicated bilateral ureteral stent placement  EBL: Minimal  Specimens: Stone for analysis  Drains: Bilateral 6 [place] by 26 cm ureteral stent  Indication: Sean Rice is a 61 y.o. patient with left distal ureteral stone as well as a large right renal stone and acute kidney injury, and opted for bilateral ureteroscopy, laser lithotripsy, and stent placement.  After reviewing the management options for treatment, they elected to proceed with the above surgical procedure(s). We have discussed the potential benefits and risks of the procedure, side effects of the proposed treatment, the likelihood of the patient achieving the goals of the procedure, and any potential problems that might occur during the procedure or recuperation. Informed consent has been obtained.  Description of procedure:  The patient was taken to the operating room and general anesthesia was induced. SCDs were placed for DVT prophylaxis. The patient was placed in the dorsal lithotomy position, prepped and draped in the usual sterile fashion, and preoperative antibiotics(Ancef) were administered. A preoperative time-out was performed.   A 21 French rigid cystoscope was used to intubate the urethra and a normal-appearing urethra was  followed proximally into the bladder.  The prostate was small.  The bladder was grossly normal.  There was a yellow stone crowning at the left ureteral orifice.  With the aid of an access catheter I was able to advance a sensor wire alongside the stone up to the kidney under fluoroscopic vision.  A semirigid ureteroscope was advanced alongside the wire and a 7 mm left distal ureteral stone dusted with a 200 m laser fiber on settings of 1.0 J and 10 Hz, and all fragments were irrigated free from the ureter.  Stone was sent for analysis.  A retrograde pyelogram performed through the scope showed no extravasation or filling defects.  The rigid cystoscope was backloaded over the wire and a 6 [place] by 26 cm ureteral stent was uneventfully placed on the left side with an excellent curl in the renal pelvis, as well as in the bladder under direct vision.  I turned my attention to the right side and a sensor wire advanced easily up to the kidney under fluoroscopic vision.  A digital single-channel flexible ureteroscope advanced easily over the wire up to the kidney and there was a large 1 cm stone in the renal pelvis.  The 200 m laser fiber on settings of 0.5 J and 80 Hz was used to methodically dust the stone.  Thorough pyeloscopy at the conclusion showed no fragments larger than the laser fiber.  Retrograde pyelogram performed from the proximal ureter showed no extravasation or filling defects.  Pullback ureteroscopy showed no ureteral injury or residual stones.  A sensor wire was replaced through the ureteroscope into the kidney.  A rigid cystoscope was backloaded over the wire and a 6 [place] by 26 cm ureteral stent was uneventfully placed with a curl in the renal pelvis, as well as in the bladder.  The bladder was drained and  this concluded our procedure.  Disposition: Stable to PACU  Plan: Follow-up in clinic for stent removal on Thursday 11/16 Follow-up stone analysis, consider repeat 24-hour urine with  his recurrent stone disease Okay for discharge today from urology perspective  Legrand Rams, MD

## 2022-03-20 NOTE — ED Provider Notes (Signed)
[facility] Provider Note    Event Date/Time   First MD Initiated Contact with Patient 03/20/22 0203     (approximate)   History   Flank Pain   HPI  Sean Rice is a 61 y.o. male   Past medical history of Kidney stones frequently, required procedural intervention in the past by urology 2019;  Presents with left-sided flank pain reminiscent of kidney stones in the past.  Denies dysuria or frequency.  No systemic infectious symptoms like fever or chills.  No injuries.   History was obtained via the patient and review of the medical notes including urology note in 2019 for procedure renal stone.      Physical Exam   Triage Vital Signs: ED Triage Vitals  Enc Vitals Group     BP 03/20/22 0108 111/72     Pulse Rate 03/20/22 0108 69     Resp 03/20/22 0108 18     Temp 03/20/22 0108 98 F (36.7 C)     Temp Source 03/20/22 0108 Oral     SpO2 03/20/22 0108 96 %     Weight 03/20/22 0035 238 lb 1.6 oz (108 kg)     Height 03/20/22 0035 6\' 2"  (1.88 m)     Head Circumference --      Peak Flow --      Pain Score 03/20/22 0035 10     Pain Loc --      Pain Edu? --      Excl. in GC? --     Most recent vital signs: Vitals:   03/20/22 0108  BP: 111/72  Pulse: 69  Resp: 18  Temp: 98 F (36.7 C)  SpO2: 96%    General: Awake, no distress.  CV:  Good peripheral perfusion.  Resp:  Normal effort.  Abd:  No distention. Non tender Other:   Nontoxic comfortable L CVA tenderness   ED Results / Procedures / Treatments   Labs (all labs ordered are listed, but only abnormal results are displayed) Labs Reviewed  CBC WITH DIFFERENTIAL/PLATELET - Abnormal; Notable for the following components:      Result Value   WBC 14.3 (*)    Neutro Abs 8.4 (*)    Monocytes Absolute 1.3 (*)    Abs Immature Granulocytes 0.15 (*)    All other components within normal limits  COMPREHENSIVE METABOLIC PANEL - Abnormal; Notable for the following components:   Potassium  3.3 (*)    Glucose, Bld 108 (*)    BUN 29 (*)    Creatinine, Ser 2.72 (*)    GFR, Estimated 26 (*)    All other components within normal limits  URINALYSIS, ROUTINE W REFLEX MICROSCOPIC - Abnormal; Notable for the following components:   Color, Urine YELLOW (*)    APPearance CLEAR (*)    Hgb urine dipstick MODERATE (*)    All other components within normal limits     I reviewed labs and they are notable for creatinine of 2.72   RADIOLOGY I independently reviewed and interpreted CT of the abdomen and see hyperdensity in the right kidney   PROCEDURES:  Critical Care performed: No  Procedures   MEDICATIONS ORDERED IN ED: Medications  sodium chloride 0.9 % bolus 1,000 mL (1,000 mLs Intravenous Bolus from Bag 03/20/22 0231)  morphine (PF) 4 MG/ML injection 4 mg (4 mg Intravenous Given 03/20/22 0232)  ondansetron (ZOFRAN) injection 4 mg (4 mg Intravenous Given 03/20/22 0232)    Consultants:  I spoke with  hospitalist for admission and regarding care plan for this patient.   IMPRESSION / MDM / ASSESSMENT AND PLAN / ED COURSE  I reviewed the triage vital signs and the nursing notes.                              Differential diagnosis includes, but is not limited to, renal colic, renal stone, pyelonephritis, intra-abdominal infection   MDM: This patient with history of renal stones requiring procedures, large stones in the past, with renal colic on presentation today got labs which showed a creatinine of 2.72 acute renal injury and a 7 mm obstructing stone with some hydronephrosis on the left side.  Given the rise in his creatinine and obstructive uropathy, will admit for pain control, hydration and urology consultation in the morning.  No infection on urinalysis.   Patient's presentation is most consistent with acute presentation with potential threat to life or bodily function.       FINAL CLINICAL IMPRESSION(S) / ED DIAGNOSES   Final diagnoses:  Nephrolithiasis   Obstructive uropathy     Rx / DC Orders   ED Discharge Orders     None        Note:  This document was prepared using Dragon voice recognition software and may include unintentional dictation errors.    Pilar Jarvis, MD 03/20/22 [id number]

## 2022-03-20 NOTE — Progress Notes (Signed)
Discharge instructions were reviewed with pt. IV was taken out. Questions were answered. Pt voided 200 ml after surgery.

## 2022-03-20 NOTE — Anesthesia Postprocedure Evaluation (Signed)
Anesthesia Post Note  Patient: Sean Rice  Procedure(s) Performed: CYSTOSCOPY/URETEROSCOPY/HOLMIUM LASER/STENT PLACEMENT (Bilateral: Ureter)  Patient location during evaluation: PACU Anesthesia Type: General Level of consciousness: awake and alert, oriented and patient cooperative Pain management: pain level controlled Vital Signs Assessment: post-procedure vital signs reviewed and stable Respiratory status: spontaneous breathing, nonlabored ventilation and respiratory function stable Cardiovascular status: blood pressure returned to baseline and stable Postop Assessment: adequate PO intake Anesthetic complications: yes   Encounter Notable Events  Notable Event Outcome Phase Comment  Difficult to intubate - unexpected  Intraprocedure Filed from anesthesia note documentation.     Last Vitals:  Vitals:   03/20/22 1415 03/20/22 1429  BP: 107/76   Pulse: 65 67  Resp: (!) 0 12  Temp:    SpO2: 98% 95%    Last Pain:  Vitals:   03/20/22 1429  TempSrc:   PainSc: 0-No pain                 Reed Breech

## 2022-03-20 NOTE — Hospital Course (Signed)
Sean Rice is a 61 yo male with PMH nephrolithiasis, ongoing tobacco use, migraines who presented with left-sided back and flank pain.  Presentation felt similar to his prior episodes of kidney stones.  He had no fevers, chills, dysuria, hematuria. On work-up he was found to have elevated creatinine above baseline.  CT renal stone protocol showed 7 mm left UVJ calculus with mild left-sided hydronephrosis, hydroureter, and perinephric fat stranding.  There was also 18 mm renal calculus in the right renal pelvis. Incidental finding of 12 mm noncalcified right lower lobe lung nodule also noted.  Findings were discussed with patient in person during hospitalization and he voiced understanding especially in setting of underlying ongoing tobacco use.  Recommendation was made for outpatient dedicated chest CT for further thorough evaluation.   He underwent evaluation with urology on admission and underwent cystoscopy with bilateral ureteral stent placement.  He will follow-up outpatient for further management with urology.  He was afebrile with no signs of infection on urinalysis on admission.  Leukocytosis was felt to be reactive in nature.  He did not require antibiotics. He was discharged home in stable condition.

## 2022-03-20 NOTE — Plan of Care (Signed)
Patient axox4,RA, ambulates independently. Patient notified nurse of bumpy growths to testicles that he extracts content from that looks like rubbery calk using a toenail clipper, nurse educated patient not to do this due to risk of injury and infection. Nurse notified urology when they came in to speak to patient regarding procedure. Urologist PA told patient  that she would have provider assess area during procedure today. Remained NPO up to procedure.  Problem: Education: Goal: Knowledge of General Education information will improve Description: Including pain rating scale, medication(s)/side effects and non-pharmacologic comfort measures Outcome: Progressing   Problem: Health Behavior/Discharge Planning: Goal: Ability to manage health-related needs will improve Outcome: Progressing   Problem: Clinical Measurements: Goal: Ability to maintain clinical measurements within normal limits will improve Outcome: Progressing Goal: Will remain free from infection Outcome: Progressing Goal: Diagnostic test results will improve Outcome: Progressing Goal: Respiratory complications will improve Outcome: Progressing Goal: Cardiovascular complication will be avoided Outcome: Progressing   Problem: Nutrition: Goal: Adequate nutrition will be maintained Outcome: Progressing   Problem: Coping: Goal: Level of anxiety will decrease Outcome: Progressing   Problem: Activity: Goal: Risk for activity intolerance will decrease Outcome: Progressing   Problem: Elimination: Goal: Will not experience complications related to bowel motility Outcome: Progressing Goal: Will not experience complications related to urinary retention Outcome: Progressing   Problem: Pain Managment: Goal: General experience of comfort will improve Outcome: Progressing   Problem: Safety: Goal: Ability to remain free from injury will improve Outcome: Progressing   Problem: Skin Integrity: Goal: Risk for impaired skin  integrity will decrease Outcome: Progressing

## 2022-03-20 NOTE — Plan of Care (Signed)

## 2022-03-20 NOTE — H&P (View-Only) (Signed)
Urology Consult  I have been asked to see the patient by Dr. Para March, for evaluation and management of renal colic.  Chief Complaint: Left flank pain, nausea  History of Present Illness: Sean Rice is a 61 y.o. year old male with PMH recurrent nephrolithiasis requiring ureteroscopy and ESWL in the past currently admitted with left renal colic and AKI associated with a 7 mm left UVJ stone.  He was originally seen in the ED 2 days ago with left flank pain, however imaging was deferred when his pain resolved.  He returned to the ED overnight with recurrent severe left flank pain.  CT stone study shows a 7 mm left UVJ stone with mild left hydro and ureteral nephrosis and perinephric stranding as well as an 18 mm nonobstructing right renal pelvic stone.  Creatinine significantly elevated over baseline at 2.72, baseline 1.22.  UA is rather bland with no nitrites, 6-10 RBCs/hpf, no pyuria, and no bacteriuria.  He is accompanied today at the bedside by his wife.  They report that he has tolerated stents well in the past.  He had a prior ESWL, which failed.  His most recent stone episode was in March 2023 with a stone that he spontaneously passed while traveling in [place].  He passed a small stone at home 2 days ago when his symptoms began.  Past Medical History:  Diagnosis Date   Kidney stones    Migraine     Past Surgical History:  Procedure Laterality Date   CYSTOSCOPY/URETEROSCOPY/HOLMIUM LASER/STENT PLACEMENT Left 11/25/2017   Procedure: CYSTOSCOPY/URETEROSCOPY/HOLMIUM LASER/STENT PLACEMENT;  Surgeon: Vanna Scotland, MD;  Location: [facility] ORS;  Service: Urology;  Laterality: Left;   EXTRACORPOREAL SHOCK WAVE LITHOTRIPSY Left 11/25/2017   Procedure: EXTRACORPOREAL SHOCK WAVE LITHOTRIPSY (ESWL);  Surgeon: Vanna Scotland, MD;  Location: [facility] ORS;  Service: Urology;  Laterality: Left;    Home Medications:  No outpatient medications have been marked as taking for the 03/20/22 encounter  [facility] Encounter).    Allergies: No Known Allergies  No family history on file.  Social History:  reports that he has been smoking cigarettes. He has a 0.13 pack-year smoking history. He has never used smokeless tobacco. He reports that he does not drink alcohol and does not use drugs.  ROS: A complete review of systems was performed.  All systems are negative except for pertinent findings as noted.  Physical Exam:  Vital signs in last 24 hours: Temp:  [98 F (36.7 C)-98.1 F (36.7 C)] 98.1 F (36.7 C) (11/10 0733) Pulse Rate:  [58-69] 58 (11/10 0733) Resp:  [18-20] 19 (11/10 0733) BP: (84-113)/(42-72) 88/42 (11/10 0803) SpO2:  [95 %-96 %] 96 % (11/10 0733) Weight:  [108 kg] 108 kg (11/10 0035) Constitutional:  Alert and oriented, no acute distress HEENT: Bandana AT, moist mucus membranes Cardiovascular: No clubbing, cyanosis, or edema Respiratory: Normal respiratory effort Skin: No rashes, bruises or suspicious lesions Neurologic: Grossly intact, no focal deficits, moving all 4 extremities Psychiatric: Normal mood and affect  Laboratory Data:  Recent Labs    03/18/22 0150 03/20/22 0105  WBC 14.1* 14.3*  HGB 15.7 15.2  HCT 45.5 43.4   Recent Labs    03/18/22 0150 03/20/22 0105  NA 141 136  K 3.1* 3.3*  CL 104 100  CO2 26 27  GLUCOSE 114* 108*  BUN 28* 29*  CREATININE 1.80* 2.72*  CALCIUM 9.2 9.0   Urinalysis    Component Value Date/Time   COLORURINE YELLOW (A) 03/20/2022 0105   APPEARANCEUR  CLEAR (A) 03/20/2022 0105   LABSPEC 1.019 03/20/2022 0105   PHURINE 5.0 03/20/2022 0105   GLUCOSEU NEGATIVE 03/20/2022 0105   HGBUR MODERATE (A) 03/20/2022 0105   BILIRUBINUR NEGATIVE 03/20/2022 0105   KETONESUR NEGATIVE 03/20/2022 0105   PROTEINUR NEGATIVE 03/20/2022 0105   NITRITE NEGATIVE 03/20/2022 0105   LEUKOCYTESUR NEGATIVE 03/20/2022 0105   Results for orders placed or performed during the hospital encounter of 12/18/20  Novel Coronavirus, NAA ([facility])      Status: Abnormal   Collection Time: 12/18/20  6:53 PM   Specimen: Nasopharyngeal Swab; Nasopharyngeal(NP) swabs in vial transport medium   Nasopharynge  Result Value Ref Range Status   SARS-CoV-2, NAA Detected (A) Not Detected Final    Comment: Patients who have a positive COVID-19 test result may now have treatment options. Treatment options are available for patients with mild to moderate symptoms and for hospitalized patients. Visit our website at [email or web address] for resources and information. This nucleic acid amplification test was developed and its performance characteristics determined by [employer]. Nucleic acid amplification tests include RT-PCR and TMA. This test has not been FDA cleared or approved. This test has been authorized by FDA under an Emergency Use Authorization (EUA). This test is only authorized for the duration of time the declaration that circumstances exist justifying the authorization of the emergency use of in vitro diagnostic tests for detection of SARS-CoV-2 virus and/or diagnosis of COVID-19 infection under section 564(b)(1) of the Act, 21 U.S.C. 001VCB-4(W) (1), unless the authorization is terminated or revoked sooner. When diagnostic testing is negativ e, the possibility of a false negative result should be considered in the context of a patient's recent exposures and the presence of clinical signs and symptoms consistent with COVID-19. An individual without symptoms of COVID-19 and who is not shedding SARS-CoV-2 virus would expect to have a negative (not detected) result in this assay.   SARS-COV-2, NAA 2 DAY TAT     Status: None   Collection Time: 12/18/20  6:53 PM   Nasopharynge  Result Value Ref Range Status   SARS-CoV-2, NAA 2 DAY TAT Performed  Final    Radiologic Imaging: CT Renal Stone Study  Result Date: 03/20/2022 CLINICAL DATA:  Left flank pain. EXAM: CT ABDOMEN AND PELVIS WITHOUT CONTRAST TECHNIQUE:  Multidetector CT imaging of the abdomen and pelvis was performed following the standard protocol without IV contrast. RADIATION DOSE REDUCTION: This exam was performed according to the departmental dose-optimization program which includes automated exposure control, adjustment of the mA and/or kV according to patient size and/or use of iterative reconstruction technique. COMPARISON:  Oct 07, 2020 FINDINGS: Lower chest: A 12 mm noncalcified lung nodule is seen within the posteromedial aspect of the right lung base (measured 10 mm on the prior exam). Hepatobiliary: No focal liver abnormality is seen. No gallstones, gallbladder wall thickening, or biliary dilatation. Pancreas: Unremarkable. No pancreatic ductal dilatation or surrounding inflammatory changes. Spleen: Normal in size without focal abnormality. Adrenals/Urinary Tract: Adrenal glands are unremarkable. Kidneys are normal in size. Stable bilateral renal cysts are noted. An 18 mm renal calculus is seen within the right renal pelvis. A 7 mm renal calculus is seen at the left UVJ, with mild left-sided hydronephrosis, hydroureter and perinephric inflammatory fat stranding. An additional 5 mm renal calculus is seen within the urinary bladder on the left, near the left UVJ. This is present on the prior study. Bladder is unremarkable. Stomach/Bowel: Stomach is within normal limits. Appendix appears normal. No evidence of bowel wall thickening,  distention, or inflammatory changes. Vascular/Lymphatic: Aortic atherosclerosis. No enlarged abdominal or pelvic lymph nodes. Reproductive: Prostate is unremarkable. Other: No abdominal wall hernia or abnormality. No abdominopelvic ascites. Musculoskeletal: No acute or significant osseous findings. IMPRESSION: 1. 7 mm left UVJ renal calculus with mild left-sided hydronephrosis, hydroureter and perinephric inflammatory fat stranding. 2. 18 mm renal calculus within the right renal pelvis. 3. Stable bilateral renal cysts. No  follow-up imaging is recommended. This recommendation follows ACR consensus guidelines: Management of the Incidental Renal Mass on CT: A White Paper of the ACR Incidental Findings Committee. J Am Coll Radiol 2018;15:264-273. 4. 12 mm noncalcified lung nodule within the posteromedial aspect of the right lung base (measured 10 mm on the prior exam). Further evaluation with complete nonemergent chest CT is recommended. 5. Aortic atherosclerosis. Aortic Atherosclerosis (ICD10-I70.0). Electronically Signed   By: Aram Candela M.D.   On: 03/20/2022 01:09    Assessment & Plan:  61 year old male with PMH recurrent nephrolithiasis currently admitted with left renal colic and AKI associated with a 7 mm left UVJ stone, also with a nonobstructing 18 mm right renal pelvis stone on CT.  We discussed that he is unlikely to pass his left UVJ stone given its size.  With his rising creatinine, we recommend proceeding to the OR this afternoon for bilateral ureteroscopy with laser lithotripsy and stent placement.  We discussed that if Dr. Richardo Hanks is not able to clear his right renal pelvic stone today, we will proceed with a staged approach with left URS/LL/stent placement and right ureteral stent placement with plans for follow-up right ureteroscopy in 2 to 3 weeks.  He is in agreement with this plan.  Barring any signs of infection intraoperatively, we will tentatively plan for discharge following surgery this afternoon.  Please keep him n.p.o. in advance of procedure.  Thank you for involving me in this patient's care, I will continue to follow along.  Carman Ching, PA-C 03/20/2022 8:59 AM

## 2022-03-20 NOTE — Transfer of Care (Signed)
Immediate Anesthesia Transfer of Care Note  Patient: Sean Rice  Procedure(s) Performed: CYSTOSCOPY/URETEROSCOPY/HOLMIUM LASER/STENT PLACEMENT (Bilateral: Ureter)  Patient Location: PACU  Anesthesia Type:General  Level of Consciousness: awake, alert , and oriented  Airway & Oxygen Therapy: Patient Spontanous Breathing and Patient connected to face mask oxygen  Post-op Assessment: Report given to RN and Post -op Vital signs reviewed and stable  Post vital signs: Reviewed and stable  Last Vitals:  Vitals Value Taken Time  BP 107/82 03/20/22 1409  Temp 35.9 C 03/20/22 1409  Pulse 63 03/20/22 1415  Resp 0 03/20/22 1415  SpO2 96 % 03/20/22 1415  Vitals shown include unvalidated device data.  Last Pain:  Vitals:   03/20/22 1409  TempSrc:   PainSc: Asleep      Patients Stated Pain Goal: 0 (03/20/22 1018)  Complications:  Encounter Notable Events  Notable Event Outcome Phase Comment  Difficult to intubate - unexpected  Intraprocedure Filed from anesthesia note documentation.

## 2022-03-20 NOTE — Progress Notes (Signed)
Pt came back from surgery. Bedside report received

## 2022-03-20 NOTE — Anesthesia Preprocedure Evaluation (Addendum)
Anesthesia Evaluation  Patient identified by MRN, date of birth, ID band Patient awake    Reviewed: Allergy & Precautions, NPO status , Patient's Chart, lab work & pertinent test results  History of Anesthesia Complications Negative for: history of anesthetic complications  Airway Mallampati: III   Neck ROM: Full    Dental no notable dental hx.    Pulmonary Current Smoker (1 ppd)Patient did not abstain from smoking.   Pulmonary exam normal breath sounds clear to auscultation       Cardiovascular hypertension, Normal cardiovascular exam Rhythm:Regular Rate:Normal  ECG 06/09/21: normal   Neuro/Psych  Headaches    GI/Hepatic Last Mounjaro dose 03/13/22   Endo/Other  Obesity   Renal/GU Renal disease (nephrolithiasis)     Musculoskeletal   Abdominal   Peds  Hematology negative hematology ROS (+)   Anesthesia Other Findings   Reproductive/Obstetrics                             Anesthesia Physical Anesthesia Plan  ASA: 2  Anesthesia Plan: General   Post-op Pain Management:    Induction: Intravenous  PONV Risk Score and Plan: 1 and Ondansetron, Dexamethasone and Treatment may vary due to age or medical condition  Airway Management Planned: Oral ETT  Additional Equipment:   Intra-op Plan:   Post-operative Plan: Extubation in OR  Informed Consent: I have reviewed the patients History and Physical, chart, labs and discussed the procedure including the risks, benefits and alternatives for the proposed anesthesia with the patient or authorized representative who has indicated his/her understanding and acceptance.     Dental advisory given  Plan Discussed with: CRNA  Anesthesia Plan Comments: (Patient consented for risks of anesthesia including but not limited to:  - adverse reactions to medications - damage to eyes, teeth, lips or other oral mucosa - nerve damage due to positioning   - sore throat or hoarseness - damage to heart, brain, nerves, lungs, other parts of body or loss of life  Informed patient about role of CRNA in peri- and intra-operative care.  Patient voiced understanding.)        Anesthesia Quick Evaluation

## 2022-03-20 NOTE — Discharge Summary (Signed)
Physician Discharge Summary   Alija Riano UYQ:[id number] DOB: 05-09-1961 DOA: 03/20/2022  PCP: Mick Sell, MD  Admit date: 03/20/2022 Discharge date: 03/20/2022 Barriers to discharge: none  Admitted From: Home Disposition:  Home Discharging physician: Lewie Chamber, MD  Recommendations for Outpatient Follow-up:  Follow up with urology 11/16 for stent removal and stone analysis  Needs outpatient CT chest for 12 mm RLL nodule found on renal stone CT; findings discussed at length with patient esp regarding smoking cessation   Home Health:  Equipment/Devices:   Discharge Condition: stable CODE STATUS: Full Diet recommendation:  Diet Orders (From admission, onward)     Start     Ordered   03/20/22 1500  Diet regular Room service appropriate? Yes; Fluid consistency: Thin  Diet effective now       Question Answer Comment  Room service appropriate? Yes   Fluid consistency: Thin      03/20/22 1459   03/20/22 0000  Diet general        03/20/22 1552            Hospital Course: Mr. Karel is a 61 yo male with PMH nephrolithiasis, ongoing tobacco use, migraines who presented with left-sided back and flank pain.  Presentation felt similar to his prior episodes of kidney stones.  He had no fevers, chills, dysuria, hematuria. On work-up he was found to have elevated creatinine above baseline.  CT renal stone protocol showed 7 mm left UVJ calculus with mild left-sided hydronephrosis, hydroureter, and perinephric fat stranding.  There was also 18 mm renal calculus in the right renal pelvis. Incidental finding of 12 mm noncalcified right lower lobe lung nodule also noted.  Findings were discussed with patient in person during hospitalization and he voiced understanding especially in setting of underlying ongoing tobacco use.  Recommendation was made for outpatient dedicated chest CT for further thorough evaluation.   He underwent evaluation with urology on admission and underwent  cystoscopy with bilateral ureteral stent placement.  He will follow-up outpatient for further management with urology.  He was afebrile with no signs of infection on urinalysis on admission.  Leukocytosis was felt to be reactive in nature.  He did not require antibiotics. He was discharged home in stable condition.   The patient's chronic medical conditions were treated accordingly per the patient's home medication regimen except as noted.  On day of discharge, patient was felt deemed stable for discharge. Patient/family member advised to call PCP or come back to ER if needed.   Principal Diagnosis: Hydronephrosis with urinary obstruction due to ureteral calculus  Discharge Diagnoses: Active Hospital Problems   Diagnosis Date Noted   Hydronephrosis with urinary obstruction due to ureteral calculus 01/22/2015    Priority: High   AKI (acute kidney injury) (HCC) 03/20/2022   Nodule of right lung 03/20/2022   HTN (hypertension), benign 07/15/2021    Resolved Hospital Problems  No resolved problems to display.     Discharge Instructions     Diet general   Complete by: As directed    Increase activity slowly   Complete by: As directed    No wound care   Complete by: As directed       Allergies as of 03/20/2022   No Known Allergies      Medication List     STOP taking these medications    cyclobenzaprine 10 MG tablet Commonly known as: FLEXERIL   ondansetron 4 MG disintegrating tablet Commonly known as: ZOFRAN-ODT   oxyCODONE-acetaminophen 5-325 MG tablet  Commonly known as: Percocet   predniSONE 10 MG (21) Tbpk tablet Commonly known as: STERAPRED UNI-PAK 21 TAB       TAKE these medications    hydrochlorothiazide 25 MG tablet Commonly known as: HYDRODIURIL Take 1 tablet (25 mg total) by mouth daily.   SUMAtriptan 50 MG tablet Commonly known as: Imitrex Take 1 tablet (50 mg total) by mouth once as needed for migraine. May repeat in 2 hours if headache persists  or recurs.   tamsulosin 0.4 MG Caps capsule Commonly known as: FLOMAX Take 1 capsule (0.4 mg total) by mouth daily for 14 days. Start taking on: March 21, 2022        Follow-up Information     Sondra Come, MD. Go on 03/26/2022.   Specialty: Urology Contact information: [address] [place] [address] [phone]         Mick Sell, MD. Schedule an appointment as soon as possible for a visit in 2 week(s).   Specialty: Infectious Diseases Why: Needs dedicated CT chest for right lung nodule found on renal stone study Contact information: [address] [place] [address] [phone]                No Known Allergies  Consultations: Urology  Procedures: 11/10: cystoscopy with B/L ureteral stent placement   Discharge Exam: BP 106/76 (BP Location: Left Arm)   Pulse 76   Temp 98 F (36.7 C) (Oral)   Resp 17   Ht 6\' 2"  (1.88 m)   Wt 108 kg   SpO2 96%   BMI 30.57 kg/m  Physical Exam Constitutional:      General: He is not in acute distress.    Appearance: Normal appearance.  HENT:     Head: Normocephalic and atraumatic.     Mouth/Throat:     Mouth: Mucous membranes are moist.  Eyes:     Extraocular Movements: Extraocular movements intact.  Cardiovascular:     Rate and Rhythm: Normal rate and regular rhythm.     Heart sounds: Normal heart sounds.  Pulmonary:     Effort: Pulmonary effort is normal. No respiratory distress.     Breath sounds: Normal breath sounds. No wheezing.  Abdominal:     General: Bowel sounds are normal. There is no distension.     Palpations: Abdomen is soft.     Tenderness: There is no abdominal tenderness.  Musculoskeletal:        General: Normal range of motion.     Cervical back: Normal range of motion and neck supple.  Skin:    General: Skin is warm and dry.  Neurological:     General: No focal deficit present.     Mental Status: He is alert.  Psychiatric:        Mood and  Affect: Mood normal.        Behavior: Behavior normal.      The results of significant diagnostics from this hospitalization (including imaging, microbiology, ancillary and laboratory) are listed below for reference.   Microbiology: No results found for this or any previous visit (from the past 240 hour(s)).   Labs: BNP (last 3 results) No results for input(s): "BNP" in the last 8760 hours. Basic Metabolic Panel: Recent Labs  Lab 03/18/22 0150 03/20/22 0105  NA 141 136  K 3.1* 3.3*  CL 104 100  CO2 26 27  GLUCOSE 114* 108*  BUN 28* 29*  CREATININE 1.80* 2.72*  CALCIUM 9.2 9.0  Liver Function Tests: Recent Labs  Lab 03/20/22 0105  AST 19  ALT 35  ALKPHOS 70  BILITOT 0.7  PROT 7.2  ALBUMIN 4.0   No results for input(s): "LIPASE", "AMYLASE" in the last 168 hours. No results for input(s): "AMMONIA" in the last 168 hours. CBC: Recent Labs  Lab 03/18/22 0150 03/20/22 0105  WBC 14.1* 14.3*  NEUTROABS 7.5 8.4*  HGB 15.7 15.2  HCT 45.5 43.4  MCV 88.7 89.1  PLT 356 318   Cardiac Enzymes: No results for input(s): "CKTOTAL", "CKMB", "CKMBINDEX", "TROPONINI" in the last 168 hours. BNP: Invalid input(s): "POCBNP" CBG: No results for input(s): "GLUCAP" in the last 168 hours. D-Dimer No results for input(s): "DDIMER" in the last 72 hours. Hgb A1c No results for input(s): "HGBA1C" in the last 72 hours. Lipid Profile No results for input(s): "CHOL", "HDL", "LDLCALC", "TRIG", "CHOLHDL", "LDLDIRECT" in the last 72 hours. Thyroid function studies No results for input(s): "TSH", "T4TOTAL", "T3FREE", "THYROIDAB" in the last 72 hours.  Invalid input(s): "FREET3" Anemia work up No results for input(s): "VITAMINB12", "FOLATE", "FERRITIN", "TIBC", "IRON", "RETICCTPCT" in the last 72 hours. Urinalysis    Component Value Date/Time   COLORURINE YELLOW (A) 03/20/2022 0105   APPEARANCEUR CLEAR (A) 03/20/2022 0105   LABSPEC 1.019 03/20/2022 0105   PHURINE 5.0 03/20/2022  0105   GLUCOSEU NEGATIVE 03/20/2022 0105   HGBUR MODERATE (A) 03/20/2022 0105   BILIRUBINUR NEGATIVE 03/20/2022 0105   KETONESUR NEGATIVE 03/20/2022 0105   PROTEINUR NEGATIVE 03/20/2022 0105   NITRITE NEGATIVE 03/20/2022 0105   LEUKOCYTESUR NEGATIVE 03/20/2022 0105   Sepsis Labs Recent Labs  Lab 03/18/22 0150 03/20/22 0105  WBC 14.1* 14.3*   Microbiology No results found for this or any previous visit (from the past 240 hour(s)).  Procedures/Studies: DG OR UROLOGY CYSTO IMAGE ([facility] ONLY)  Result Date: 03/20/2022 There is no interpretation for this exam.  This order is for images obtained during a surgical procedure.  Please See "Surgeries" Tab for more information regarding the procedure.   CT Renal Stone Study  Result Date: 03/20/2022 CLINICAL DATA:  Left flank pain. EXAM: CT ABDOMEN AND PELVIS WITHOUT CONTRAST TECHNIQUE: Multidetector CT imaging of the abdomen and pelvis was performed following the standard protocol without IV contrast. RADIATION DOSE REDUCTION: This exam was performed according to the departmental dose-optimization program which includes automated exposure control, adjustment of the mA and/or kV according to patient size and/or use of iterative reconstruction technique. COMPARISON:  Oct 07, 2020 FINDINGS: Lower chest: A 12 mm noncalcified lung nodule is seen within the posteromedial aspect of the right lung base (measured 10 mm on the prior exam). Hepatobiliary: No focal liver abnormality is seen. No gallstones, gallbladder wall thickening, or biliary dilatation. Pancreas: Unremarkable. No pancreatic ductal dilatation or surrounding inflammatory changes. Spleen: Normal in size without focal abnormality. Adrenals/Urinary Tract: Adrenal glands are unremarkable. Kidneys are normal in size. Stable bilateral renal cysts are noted. An 18 mm renal calculus is seen within the right renal pelvis. A 7 mm renal calculus is seen at the left UVJ, with mild left-sided  hydronephrosis, hydroureter and perinephric inflammatory fat stranding. An additional 5 mm renal calculus is seen within the urinary bladder on the left, near the left UVJ. This is present on the prior study. Bladder is unremarkable. Stomach/Bowel: Stomach is within normal limits. Appendix appears normal. No evidence of bowel wall thickening, distention, or inflammatory changes. Vascular/Lymphatic: Aortic atherosclerosis. No enlarged abdominal or pelvic lymph nodes. Reproductive: Prostate is unremarkable. Other: No abdominal  wall hernia or abnormality. No abdominopelvic ascites. Musculoskeletal: No acute or significant osseous findings. IMPRESSION: 1. 7 mm left UVJ renal calculus with mild left-sided hydronephrosis, hydroureter and perinephric inflammatory fat stranding. 2. 18 mm renal calculus within the right renal pelvis. 3. Stable bilateral renal cysts. No follow-up imaging is recommended. This recommendation follows ACR consensus guidelines: Management of the Incidental Renal Mass on CT: A White Paper of the ACR Incidental Findings Committee. J Am Coll Radiol 2018;15:264-273. 4. 12 mm noncalcified lung nodule within the posteromedial aspect of the right lung base (measured 10 mm on the prior exam). Further evaluation with complete nonemergent chest CT is recommended. 5. Aortic atherosclerosis. Aortic Atherosclerosis (ICD10-I70.0). Electronically Signed   By: Aram Candela M.D.   On: 03/20/2022 01:09     Time coordinating discharge: Over 30 minutes    Lewie Chamber, MD  [facility] 03/20/2022, 3:52 PM

## 2022-03-20 NOTE — Consult Note (Signed)
 Urology Consult  I have been asked to see the patient by Dr. Duncan, for evaluation and management of renal colic.  Chief Complaint: Left flank pain, nausea  History of Present Illness: Sean Rice is a 61 y.o. year old male with PMH recurrent nephrolithiasis requiring ureteroscopy and ESWL in the past currently admitted with left renal colic and AKI associated with a 7 mm left UVJ stone.  He was originally seen in the ED 2 days ago with left flank pain, however imaging was deferred when his pain resolved.  He returned to the ED overnight with recurrent severe left flank pain.  CT stone study shows a 7 mm left UVJ stone with mild left hydro and ureteral nephrosis and perinephric stranding as well as an 18 mm nonobstructing right renal pelvic stone.  Creatinine significantly elevated over baseline at 2.72, baseline 1.22.  UA is rather bland with no nitrites, 6-10 RBCs/hpf, no pyuria, and no bacteriuria.  He is accompanied today at the bedside by his wife.  They report that he has tolerated stents well in the past.  He had a prior ESWL, which failed.  His most recent stone episode was in March 2023 with a stone that he spontaneously passed while traveling in [place].  He passed a small stone at home 2 days ago when his symptoms began.  Past Medical History:  Diagnosis Date   Kidney stones    Migraine     Past Surgical History:  Procedure Laterality Date   CYSTOSCOPY/URETEROSCOPY/HOLMIUM LASER/STENT PLACEMENT Left 11/25/2017   Procedure: CYSTOSCOPY/URETEROSCOPY/HOLMIUM LASER/STENT PLACEMENT;  Surgeon: Brandon, Ashley, MD;  Location: [facility] ORS;  Service: Urology;  Laterality: Left;   EXTRACORPOREAL SHOCK WAVE LITHOTRIPSY Left 11/25/2017   Procedure: EXTRACORPOREAL SHOCK WAVE LITHOTRIPSY (ESWL);  Surgeon: Brandon, Ashley, MD;  Location: [facility] ORS;  Service: Urology;  Laterality: Left;    Home Medications:  No outpatient medications have been marked as taking for the 03/20/22 encounter  (Hospital Encounter).    Allergies: No Known Allergies  No family history on file.  Social History:  reports that he has been smoking cigarettes. He has a 0.13 pack-year smoking history. He has never used smokeless tobacco. He reports that he does not drink alcohol and does not use drugs.  ROS: A complete review of systems was performed.  All systems are negative except for pertinent findings as noted.  Physical Exam:  Vital signs in last 24 hours: Temp:  [98 F (36.7 C)-98.1 F (36.7 C)] 98.1 F (36.7 C) (11/10 0733) Pulse Rate:  [58-69] 58 (11/10 0733) Resp:  [18-20] 19 (11/10 0733) BP: (84-113)/(42-72) 88/42 (11/10 0803) SpO2:  [95 %-96 %] 96 % (11/10 0733) Weight:  [108 kg] 108 kg (11/10 0035) Constitutional:  Alert and oriented, no acute distress HEENT: [place] AT, moist mucus membranes Cardiovascular: No clubbing, cyanosis, or edema Respiratory: Normal respiratory effort Skin: No rashes, bruises or suspicious lesions Neurologic: Grossly intact, no focal deficits, moving all 4 extremities Psychiatric: Normal mood and affect  Laboratory Data:  Recent Labs    03/18/22 0150 03/20/22 0105  WBC 14.1* 14.3*  HGB 15.7 15.2  HCT 45.5 43.4   Recent Labs    03/18/22 0150 03/20/22 0105  NA 141 136  K 3.1* 3.3*  CL 104 100  CO2 26 27  GLUCOSE 114* 108*  BUN 28* 29*  CREATININE 1.80* 2.72*  CALCIUM 9.2 9.0   Urinalysis    Component Value Date/Time   COLORURINE YELLOW (A) 03/20/2022 0105   APPEARANCEUR   CLEAR (A) 03/20/2022 0105   LABSPEC 1.019 03/20/2022 0105   PHURINE 5.0 03/20/2022 0105   GLUCOSEU NEGATIVE 03/20/2022 0105   HGBUR MODERATE (A) 03/20/2022 0105   BILIRUBINUR NEGATIVE 03/20/2022 0105   KETONESUR NEGATIVE 03/20/2022 0105   PROTEINUR NEGATIVE 03/20/2022 0105   NITRITE NEGATIVE 03/20/2022 0105   LEUKOCYTESUR NEGATIVE 03/20/2022 0105   Results for orders placed or performed during the hospital encounter of 12/18/20  Novel Coronavirus, NAA ([facility])      Status: Abnormal   Collection Time: 12/18/20  6:53 PM   Specimen: Nasopharyngeal Swab; Nasopharyngeal(NP) swabs in vial transport medium   Nasopharynge  Result Value Ref Range Status   SARS-CoV-2, NAA Detected (A) Not Detected Final    Comment: Patients who have a positive COVID-19 test result may now have treatment options. Treatment options are available for patients with mild to moderate symptoms and for hospitalized patients. Visit our website at [email or web address] for resources and information. This nucleic acid amplification test was developed and its performance characteristics determined by [facility]. Nucleic acid amplification tests include RT-PCR and TMA. This test has not been FDA cleared or approved. This test has been authorized by FDA under an Emergency Use Authorization (EUA). This test is only authorized for the duration of time the declaration that circumstances exist justifying the authorization of the emergency use of in vitro diagnostic tests for detection of SARS-CoV-2 virus and/or diagnosis of COVID-19 infection under section 564(b)(1) of the Act, 21 U.S.C. 360bbb-3(b) (1), unless the authorization is terminated or revoked sooner. When diagnostic testing is negativ e, the possibility of a false negative result should be considered in the context of a patient's recent exposures and the presence of clinical signs and symptoms consistent with COVID-19. An individual without symptoms of COVID-19 and who is not shedding SARS-CoV-2 virus would expect to have a negative (not detected) result in this assay.   SARS-COV-2, NAA 2 DAY TAT     Status: None   Collection Time: 12/18/20  6:53 PM   Nasopharynge  Result Value Ref Range Status   SARS-CoV-2, NAA 2 DAY TAT Performed  Final    Radiologic Imaging: CT Renal Stone Study  Result Date: 03/20/2022 CLINICAL DATA:  Left flank pain. EXAM: CT ABDOMEN AND PELVIS WITHOUT CONTRAST TECHNIQUE:  Multidetector CT imaging of the abdomen and pelvis was performed following the standard protocol without IV contrast. RADIATION DOSE REDUCTION: This exam was performed according to the departmental dose-optimization program which includes automated exposure control, adjustment of the mA and/or kV according to patient size and/or use of iterative reconstruction technique. COMPARISON:  Oct 07, 2020 FINDINGS: Lower chest: A 12 mm noncalcified lung nodule is seen within the posteromedial aspect of the right lung base (measured 10 mm on the prior exam). Hepatobiliary: No focal liver abnormality is seen. No gallstones, gallbladder wall thickening, or biliary dilatation. Pancreas: Unremarkable. No pancreatic ductal dilatation or surrounding inflammatory changes. Spleen: Normal in size without focal abnormality. Adrenals/Urinary Tract: Adrenal glands are unremarkable. Kidneys are normal in size. Stable bilateral renal cysts are noted. An 18 mm renal calculus is seen within the right renal pelvis. A 7 mm renal calculus is seen at the left UVJ, with mild left-sided hydronephrosis, hydroureter and perinephric inflammatory fat stranding. An additional 5 mm renal calculus is seen within the urinary bladder on the left, near the left UVJ. This is present on the prior study. Bladder is unremarkable. Stomach/Bowel: Stomach is within normal limits. Appendix appears normal. No evidence of bowel wall thickening,   distention, or inflammatory changes. Vascular/Lymphatic: Aortic atherosclerosis. No enlarged abdominal or pelvic lymph nodes. Reproductive: Prostate is unremarkable. Other: No abdominal wall hernia or abnormality. No abdominopelvic ascites. Musculoskeletal: No acute or significant osseous findings. IMPRESSION: 1. 7 mm left UVJ renal calculus with mild left-sided hydronephrosis, hydroureter and perinephric inflammatory fat stranding. 2. 18 mm renal calculus within the right renal pelvis. 3. Stable bilateral renal cysts. No  follow-up imaging is recommended. This recommendation follows ACR consensus guidelines: Management of the Incidental Renal Mass on CT: A White Paper of the ACR Incidental Findings Committee. J Am Coll Radiol 2018;15:264-273. 4. 12 mm noncalcified lung nodule within the posteromedial aspect of the right lung base (measured 10 mm on the prior exam). Further evaluation with complete nonemergent chest CT is recommended. 5. Aortic atherosclerosis. Aortic Atherosclerosis (ICD10-I70.0). Electronically Signed   By: Aram Candela M.D.   On: 03/20/2022 01:09    Assessment & Plan:  61 year old male with PMH recurrent nephrolithiasis currently admitted with left renal colic and AKI associated with a 7 mm left UVJ stone, also with a nonobstructing 18 mm right renal pelvis stone on CT.  We discussed that he is unlikely to pass his left UVJ stone given its size.  With his rising creatinine, we recommend proceeding to the OR this afternoon for bilateral ureteroscopy with laser lithotripsy and stent placement.  We discussed that if Dr. Richardo Hanks is not able to clear his right renal pelvic stone today, we will proceed with a staged approach with left URS/LL/stent placement and right ureteral stent placement with plans for follow-up right ureteroscopy in 2 to 3 weeks.  He is in agreement with this plan.  Barring any signs of infection intraoperatively, we will tentatively plan for discharge following surgery this afternoon.  Please keep him n.p.o. in advance of procedure.  Thank you for involving me in this patient's care, I will continue to follow along.  Carman Ching, PA-C 03/20/2022 8:59 AM

## 2022-03-21 ENCOUNTER — Other Ambulatory Visit: Payer: Self-pay

## 2022-03-21 ENCOUNTER — Emergency Department: Payer: 59

## 2022-03-21 ENCOUNTER — Observation Stay
Admission: EM | Admit: 2022-03-21 | Discharge: 2022-03-22 | Discharge status: Against Medical Advice | Payer: 59 | Attending: Internal Medicine | Admitting: Internal Medicine

## 2022-03-21 ENCOUNTER — Observation Stay
Admit: 2022-03-21 | Discharge: 2022-03-21 | Disposition: A | Discharge status: Home / Self Care | Payer: 59 | Attending: Internal Medicine | Admitting: Internal Medicine

## 2022-03-21 DIAGNOSIS — F17210 Nicotine dependence, cigarettes, uncomplicated: Secondary | ICD-10-CM | POA: Diagnosis not present

## 2022-03-21 DIAGNOSIS — Z960 Presence of urogenital implants: Secondary | ICD-10-CM | POA: Diagnosis not present

## 2022-03-21 DIAGNOSIS — E669 Obesity, unspecified: Secondary | ICD-10-CM | POA: Diagnosis present

## 2022-03-21 DIAGNOSIS — R911 Solitary pulmonary nodule: Secondary | ICD-10-CM | POA: Diagnosis not present

## 2022-03-21 DIAGNOSIS — N390 Urinary tract infection, site not specified: Secondary | ICD-10-CM | POA: Diagnosis not present

## 2022-03-21 DIAGNOSIS — E119 Type 2 diabetes mellitus without complications: Secondary | ICD-10-CM | POA: Insufficient documentation

## 2022-03-21 DIAGNOSIS — Z6836 Body mass index (BMI) 36.0-36.9, adult: Secondary | ICD-10-CM | POA: Insufficient documentation

## 2022-03-21 DIAGNOSIS — R55 Syncope and collapse: Secondary | ICD-10-CM | POA: Diagnosis not present

## 2022-03-21 DIAGNOSIS — I10 Essential (primary) hypertension: Secondary | ICD-10-CM | POA: Diagnosis not present

## 2022-03-21 DIAGNOSIS — Z794 Long term (current) use of insulin: Secondary | ICD-10-CM | POA: Diagnosis not present

## 2022-03-21 DIAGNOSIS — R109 Unspecified abdominal pain: Secondary | ICD-10-CM | POA: Diagnosis present

## 2022-03-21 DIAGNOSIS — N139 Obstructive and reflux uropathy, unspecified: Principal | ICD-10-CM | POA: Diagnosis present

## 2022-03-21 DIAGNOSIS — N179 Acute kidney failure, unspecified: Secondary | ICD-10-CM | POA: Diagnosis not present

## 2022-03-21 DIAGNOSIS — Z79899 Other long term (current) drug therapy: Secondary | ICD-10-CM | POA: Diagnosis not present

## 2022-03-21 DIAGNOSIS — N1330 Unspecified hydronephrosis: Secondary | ICD-10-CM | POA: Diagnosis not present

## 2022-03-21 LAB — URINALYSIS, ROUTINE W REFLEX MICROSCOPIC
Bilirubin Urine: NEGATIVE
Glucose, UA: NEGATIVE mg/dL
Ketones, ur: NEGATIVE mg/dL
Nitrite: NEGATIVE
Protein, ur: 100 mg/dL — AB
RBC / HPF: >50 RBC/hpf — ABNORMAL HIGH (ref 0–5)
Specific Gravity, Urine: 1.011 (ref 1.005–1.030)
WBC, UA: >50 WBC/hpf — ABNORMAL HIGH (ref 0–5)
pH: 5 (ref 5.0–8.0)

## 2022-03-21 LAB — GLUCOSE, CAPILLARY
Glucose-Capillary: 117 mg/dL — ABNORMAL HIGH (ref 70–99)
Glucose-Capillary: 115 mg/dL — ABNORMAL HIGH (ref 70–99)

## 2022-03-21 LAB — CBC
HCT: 40.8 % (ref 39.0–52.0)
Hemoglobin: 14.2 g/dL (ref 13.0–17.0)
MCH: 30.9 pg (ref 26.0–34.0)
MCHC: 34.8 g/dL (ref 30.0–36.0)
MCV: 88.9 fL (ref 80.0–100.0)
Platelets: 298 10*3/uL (ref 150–400)
RBC: 4.59 MIL/uL (ref 4.22–5.81)
RDW: 12.7 % (ref 11.5–15.5)
WBC: 22.6 10*3/uL — ABNORMAL HIGH (ref 4.0–10.5)
nRBC: 0 % (ref 0.0–0.2)

## 2022-03-21 LAB — BASIC METABOLIC PANEL
Anion gap: 7 (ref 5–15)
BUN: 42 mg/dL — ABNORMAL HIGH (ref 6–20)
CO2: 23 mmol/L (ref 22–32)
Calcium: 8.7 mg/dL — ABNORMAL LOW (ref 8.9–10.3)
Chloride: 108 mmol/L (ref 98–111)
Creatinine, Ser: 2.75 mg/dL — ABNORMAL HIGH (ref 0.61–1.24)
GFR, Estimated: 26 mL/min — ABNORMAL LOW (ref >60)
Glucose, Bld: 127 mg/dL — ABNORMAL HIGH (ref 70–99)
Potassium: 3.8 mmol/L (ref 3.5–5.1)
Sodium: 138 mmol/L (ref 135–145)

## 2022-03-21 LAB — HEPATIC FUNCTION PANEL
ALT: 27 U/L (ref 0–44)
AST: 22 U/L (ref 15–41)
Albumin: 3.4 g/dL — ABNORMAL LOW (ref 3.5–5.0)
Alkaline Phosphatase: 67 U/L (ref 38–126)
Bilirubin, Direct: <0.1 mg/dL (ref 0.0–0.2)
Total Bilirubin: 0.7 mg/dL (ref 0.3–1.2)
Total Protein: 6.6 g/dL (ref 6.5–8.1)

## 2022-03-21 LAB — LIPASE, BLOOD: Lipase: 35 U/L (ref 11–51)

## 2022-03-21 MED ORDER — SODIUM CHLORIDE 0.9 % IV SOLN
2.0000 g | Freq: Once | INTRAVENOUS | Status: AC
  Administered 2022-03-21: 2 g via INTRAVENOUS
  Filled 2022-03-21: qty 20

## 2022-03-21 MED ORDER — TAMSULOSIN HCL 0.4 MG PO CAPS
0.4000 mg | ORAL_CAPSULE | Freq: Every day | ORAL | Status: DC
  Administered 2022-03-21: 0.4 mg via ORAL
  Filled 2022-03-21: qty 1

## 2022-03-21 MED ORDER — SODIUM CHLORIDE 0.9 % IV SOLN: INTRAVENOUS | Status: AC

## 2022-03-21 MED ORDER — OXYCODONE-ACETAMINOPHEN 5-325 MG PO TABS: 2.0000 | ORAL_TABLET | Freq: Four times a day (QID) | ORAL | Status: DC | PRN

## 2022-03-21 MED ORDER — ONDANSETRON HCL 4 MG/2ML IJ SOLN: 4.0000 mg | Freq: Four times a day (QID) | INTRAMUSCULAR | Status: DC | PRN

## 2022-03-21 MED ORDER — MORPHINE SULFATE (PF) 4 MG/ML IV SOLN
4.0000 mg | Freq: Once | INTRAVENOUS | Status: AC | PRN
  Administered 2022-03-21: 4 mg via INTRAVENOUS
  Filled 2022-03-21: qty 1

## 2022-03-21 MED ORDER — SUMATRIPTAN SUCCINATE 50 MG PO TABS: 50.0000 mg | ORAL_TABLET | Freq: Once | ORAL | Status: DC | PRN

## 2022-03-21 MED ORDER — HYDROMORPHONE HCL 1 MG/ML IJ SOLN
1.0000 mg | INTRAMUSCULAR | Status: DC | PRN
  Administered 2022-03-21 (×3): 1 mg via INTRAVENOUS
  Filled 2022-03-21 (×3): qty 1

## 2022-03-21 MED ORDER — HYOSCYAMINE SULFATE 0.125 MG SL SUBL
0.1250 mg | SUBLINGUAL_TABLET | SUBLINGUAL | Status: DC | PRN
  Administered 2022-03-21: 0.125 mg via ORAL
  Filled 2022-03-21 (×2): qty 1

## 2022-03-21 MED ORDER — SODIUM CHLORIDE 0.9 % IV BOLUS
1000.0000 mL | Freq: Once | INTRAVENOUS | Status: AC
  Administered 2022-03-21: 1000 mL via INTRAVENOUS

## 2022-03-21 MED ORDER — SODIUM CHLORIDE 0.9 % IV SOLN
2.0000 g | INTRAVENOUS | Status: DC
  Filled 2022-03-21: qty 20

## 2022-03-21 MED ORDER — ONDANSETRON HCL 4 MG/2ML IJ SOLN
4.0000 mg | Freq: Once | INTRAMUSCULAR | Status: AC | PRN
  Administered 2022-03-21: 4 mg via INTRAVENOUS
  Filled 2022-03-21: qty 2

## 2022-03-21 MED ORDER — SENNOSIDES-DOCUSATE SODIUM 8.6-50 MG PO TABS
2.0000 | ORAL_TABLET | Freq: Every day | ORAL | Status: DC | PRN
  Administered 2022-03-21: 2 via ORAL
  Filled 2022-03-21: qty 2

## 2022-03-21 MED ORDER — OXYCODONE HCL 5 MG PO TABS
5.0000 mg | ORAL_TABLET | ORAL | Status: DC | PRN
  Administered 2022-03-21 (×3): 5 mg via ORAL
  Filled 2022-03-21 (×4): qty 1

## 2022-03-21 MED ORDER — ONDANSETRON HCL 4 MG PO TABS: 4.0000 mg | ORAL_TABLET | Freq: Four times a day (QID) | ORAL | Status: DC | PRN

## 2022-03-21 MED ORDER — MIRABEGRON ER 25 MG PO TB24
25.0000 mg | ORAL_TABLET | Freq: Every day | ORAL | Status: DC
  Administered 2022-03-21: 25 mg via ORAL
  Filled 2022-03-21 (×2): qty 1

## 2022-03-21 MED ORDER — POLYETHYLENE GLYCOL 3350 17 G PO PACK: 17.0000 g | PACK | Freq: Every day | ORAL | Status: DC | PRN

## 2022-03-21 NOTE — Assessment & Plan Note (Signed)
Patient with pyuria and marked leukocytosis status post recent instrumentation We will place patient empirically on Rocephin 1 g IV daily Follow-up results of urine culture

## 2022-03-21 NOTE — ED Triage Notes (Signed)
R flank pain. Pt dx with kidney stones yesterday. Reports had lithotripsy done and 2 stents placed. Pt then discharged home. Pt reports woke up this morning and had syncopal episode due to pain increase. Reports n/v as well. Reports diminished urine output. Denies chest pain but pt SOB.

## 2022-03-21 NOTE — Assessment & Plan Note (Signed)
Patient has a baseline serum creatinine of 1.2 today on admission it is 2.75 AKI initially thought to be secondary to obstructive uropathy and patient is status post bilateral stent placement Continue to hold hydrochlorothiazide Continue IV fluid hydration Pete renal parameters in a.m.

## 2022-03-21 NOTE — Consult Note (Signed)
Urology Consult  Referring physician: Dr. Joylene Igo Reason for referral: Right hydronephrosis, right flank pain  Chief Complaint: right flank pain  History of Present Illness: [name] is a 61yo with a history of nephrolithiasis who presented to the ER this morning with persistent right flank pain after his bilateral ureteroscopic stone extraction yesterday. He had resolution of his right flank pain after surgery but then the severe right flank pain recurred early this morning. He has noted worsening gross hematuria since the surgery and has been passing small clots. The right flank pain is sharp, intermittent, moderate to severe and nonradiating. He has urinary urgency and frequency which is worse since surgery. No straining to urinate. Urine stream is fair. He denies any fevers/chills/sweats. CT obtained in the ER shows right hydronephrosis with the stent curled at the UPJ. No left hydronephrosis. Creatinine remains elevated at 2.75  Past Medical History:  Diagnosis Date   Kidney stones    Migraine    Past Surgical History:  Procedure Laterality Date   CYSTOSCOPY/URETEROSCOPY/HOLMIUM LASER/STENT PLACEMENT Left 11/25/2017   Procedure: CYSTOSCOPY/URETEROSCOPY/HOLMIUM LASER/STENT PLACEMENT;  Surgeon: Vanna Scotland, MD;  Location: [facility];  Service: Urology;  Laterality: Left;   CYSTOSCOPY/URETEROSCOPY/HOLMIUM LASER/STENT PLACEMENT Bilateral 03/20/2022   Procedure: CYSTOSCOPY/URETEROSCOPY/HOLMIUM LASER/STENT PLACEMENT;  Surgeon: Sondra Come, MD;  Location: [facility];  Service: Urology;  Laterality: Bilateral;   EXTRACORPOREAL SHOCK WAVE LITHOTRIPSY Left 11/25/2017   Procedure: EXTRACORPOREAL SHOCK WAVE LITHOTRIPSY (ESWL);  Surgeon: Vanna Scotland, MD;  Location: [facility];  Service: Urology;  Laterality: Left;    Medications: I have reviewed the patient's current medications. Allergies: No Known Allergies  History reviewed. No pertinent family history. Social History:  reports that he has  been smoking cigarettes. He has a 0.13 pack-year smoking history. He has never used smokeless tobacco. He reports that he does not drink alcohol and does not use drugs.  Review of Systems  Genitourinary:  Positive for flank pain and hematuria.  All other systems reviewed and are negative.   Physical Exam:  Vital signs in last 24 hours: Temp:  [97.7 F (36.5 C)-97.9 F (36.6 C)] 97.9 F (36.6 C) (11/11 1041) Pulse Rate:  [47-87] 87 (11/11 1041) Resp:  [13-19] 16 (11/11 1041) BP: (83-130)/(40-70) 130/64 (11/11 1041) SpO2:  [93 %-100 %] 100 % (11/11 1041) Weight:  [128.4 kg] 128.4 kg (11/11 0529) Physical Exam Vitals reviewed.  Constitutional:      Appearance: Normal appearance.  HENT:     Head: Normocephalic and atraumatic.     Mouth/Throat:     Mouth: Mucous membranes are dry.  Eyes:     Extraocular Movements: Extraocular movements intact.     Pupils: Pupils are equal, round, and reactive to light.  Cardiovascular:     Rate and Rhythm: Normal rate and regular rhythm.  Pulmonary:     Effort: Pulmonary effort is normal. No respiratory distress.  Abdominal:     General: Abdomen is flat. There is no distension.  Musculoskeletal:        General: No swelling. Normal range of motion.     Cervical back: Normal range of motion and neck supple.  Skin:    General: Skin is warm and dry.  Neurological:     General: No focal deficit present.     Mental Status: He is alert and oriented to person, place, and time.  Psychiatric:        Mood and Affect: Mood normal.        Behavior: Behavior normal.  Thought Content: Thought content normal.        Judgment: Judgment normal.     Laboratory Data:  Results for orders placed or performed during the hospital encounter of 03/21/22 (from the past 72 hour(s))  Urinalysis, Routine w reflex microscopic Urine, Clean Catch     Status: Abnormal   Collection Time: 03/21/22  5:36 AM  Result Value Ref Range   Color, Urine YELLOW (A) YELLOW    APPearance HAZY (A) CLEAR   Specific Gravity, Urine 1.011 1.005 - 1.030   pH 5.0 5.0 - 8.0   Glucose, UA NEGATIVE NEGATIVE mg/dL   Hgb urine dipstick LARGE (A) NEGATIVE   Bilirubin Urine NEGATIVE NEGATIVE   Ketones, ur NEGATIVE NEGATIVE mg/dL   Protein, ur 681 (A) NEGATIVE mg/dL   Nitrite NEGATIVE NEGATIVE   Leukocytes,Ua MODERATE (A) NEGATIVE   RBC / HPF >50 (H) 0 - 5 RBC/hpf   WBC, UA >50 (H) 0 - 5 WBC/hpf   Bacteria, UA RARE (A) NONE SEEN   Squamous Epithelial / LPF 0-5 0 - 5   Mucus PRESENT     Comment: Performed at [facility], [address] [place], [place] [address]  CBC     Status: Abnormal   Collection Time: 03/21/22  5:36 AM  Result Value Ref Range   WBC 22.6 (H) 4.0 - 10.5 K/uL   RBC 4.59 4.22 - 5.81 MIL/uL   Hemoglobin 14.2 13.0 - 17.0 g/dL   HCT 00.1 74.9 - 44.9 %   MCV 88.9 80.0 - 100.0 fL   MCH 30.9 26.0 - 34.0 pg   MCHC 34.8 30.0 - 36.0 g/dL   RDW 67.5 91.6 - 38.4 %   Platelets 298 150 - 400 K/uL   nRBC 0.0 0.0 - 0.2 %    Comment: Performed at [facility], [address] [place], [place] [address]  Basic metabolic panel     Status: Abnormal   Collection Time: 03/21/22  5:36 AM  Result Value Ref Range   Sodium 138 135 - 145 mmol/L   Potassium 3.8 3.5 - 5.1 mmol/L   Chloride 108 98 - 111 mmol/L   CO2 23 22 - 32 mmol/L   Glucose, Bld 127 (H) 70 - 99 mg/dL    Comment: Glucose reference range applies only to samples taken after fasting for at least 8 hours.   BUN 42 (H) 6 - 20 mg/dL   Creatinine, Ser 3.57 (H) 0.61 - 1.24 mg/dL   Calcium 8.7 (L) 8.9 - 10.3 mg/dL   GFR, Estimated 26 (L) >60 mL/min    Comment: (NOTE) Calculated using the CKD-EPI Creatinine Equation (2021)    Anion gap 7 5 - 15    Comment: Performed at [facility], [address] [place], [place] [address]  Hepatic function panel     Status: Abnormal   Collection Time: 03/21/22  5:37 AM  Result Value Ref Range   Total Protein 6.6 6.5 - 8.1 g/dL    Albumin 3.4 (L) 3.5 - 5.0 g/dL   AST 22 15 - 41 U/L   ALT 27 0 - 44 U/L   Alkaline Phosphatase 67 38 - 126 U/L   Total Bilirubin 0.7 0.3 - 1.2 mg/dL   Bilirubin, Direct <3.9 0.0 - 0.2 mg/dL   Indirect Bilirubin NOT CALCULATED 0.3 - 0.9 mg/dL    Comment: Performed at [facility], [address] [place], [place] [address]  Lipase, blood     Status: None  Collection Time: 03/21/22  5:37 AM  Result Value Ref Range   Lipase 35 11 - 51 U/L    Comment: Performed at [facility], [address] [place], [place] [address]  Glucose, capillary     Status: Abnormal   Collection Time: 03/21/22 11:05 AM  Result Value Ref Range   Glucose-Capillary 117 (H) 70 - 99 mg/dL    Comment: Glucose reference range applies only to samples taken after fasting for at least 8 hours.  Glucose, capillary     Status: Abnormal   Collection Time: 03/21/22  4:30 PM  Result Value Ref Range   Glucose-Capillary 115 (H) 70 - 99 mg/dL    Comment: Glucose reference range applies only to samples taken after fasting for at least 8 hours.   No results found for this or any previous visit (from the past 240 hour(s)). Creatinine: Recent Labs    03/18/22 0150 03/20/22 0105 03/21/22 0536  CREATININE 1.80* 2.72* 2.75*   Baseline Creatinine: 1.2  Impression/Assessment:  61yo with right hydronephrosis after right ureteral stent placement  Plan:  I discussed the various causes of persistent hydronephrosis and ureteroscopy and stent placement with the patient and his wife. The stent likely has a small clot versus stone fragment obstructing the stent. Please admit for pain control and if the flank pain fails to improve and the hydronephrosis persists, the patient will be scheduled for right ureteral stent replacement.   Sean Rice 03/21/2022, 5:56 PM

## 2022-03-21 NOTE — Assessment & Plan Note (Signed)
Patient with a history of nephrolithiasis status post cystoscopy with lithotripsy and bilateral stent placement who presents for evaluation of worsening right flank pain. Renal stone CT was suggestive of possible stent malfunction with worsening hydronephrosis We will request urology consult

## 2022-03-21 NOTE — Assessment & Plan Note (Signed)
Patient is on weekly Mounjaro Check blood sugars with meals Maintain system carbohydrate diet

## 2022-03-21 NOTE — H&P (Addendum)
History and Physical    PatientMarland Kitchen Sean Rice JQB:[id number] DOB: [birth year] DOA: 03/21/2022 DOS: the patient was seen and examined on 03/21/2022 PCP: Sean Sell, MD  Patient coming from: Home  Chief Complaint:  Chief Complaint  Patient presents with   Flank Pain        HPI: Sean Rice is a 61 y.o. male with medical history significant for hypertension, nicotine dependence and nephrolithiasis status post cystoscopy/left retrograde pyelogram/right retrograde pyelogram/left ureteroscopy, laser lithotripsy, stent placement/right ureteroscopy, laser lithotripsy, stent placement for acute kidney injury secondary to obstructive uropathy on 03/20/22.  Patient was discharged home following the procedure in stable condition and returns to the emergency room in the early hours of the morning for evaluation of severe right flank pain. Patient states that he developed excruciating right-sided flank pain which he rated a 10 x 10 in intensity at its worst.  He had 2 syncopal episodes per patient which he attributed to the severity of the pain.  He has dysuria and hematuria but thinks it is from the recent procedure that he had.  He denies having any fever or chills.  He had nausea and vomiting prior to presenting to the ER but all that has resolved following administration of a dose of IV morphine. During my evaluation patient had systolic blood pressure in the 80s and complained of feeling dizzy and lightheaded. He received 2 L of IV fluids in the ER. He denied having any chest pain, no shortness of breath, no headache, no cough, no changes in his bowel habits, no leg swelling, no headache, no blurred vision, no focal deficit. He had a CT scan of abdomen and pelvis which showed new bilateral double-J ureteral stents since yesterday. Positioning appears appropriate and left hydronephrosis has regressed. But right hydronephrosis appears increased. Query stent malfunction on that side. The 17 mm right  renal pelvic calculus is no longer identified, with smaller stones or stone fragments now in the dependent right collecting system.  Labs show a white count of 22.6, pyuria, serum creatinine of 2.75 compared to 2.72 on 03/20/22. He received a dose of IV Rocephin and morphine 4 mg in the ER and will be referred to observation status for further evaluation.  Review of Systems: As mentioned in the history of present illness. All other systems reviewed and are negative. Past Medical History:  Diagnosis Date   Kidney stones    Migraine    Past Surgical History:  Procedure Laterality Date   CYSTOSCOPY/URETEROSCOPY/HOLMIUM LASER/STENT PLACEMENT Left 11/25/2017   Procedure: CYSTOSCOPY/URETEROSCOPY/HOLMIUM LASER/STENT PLACEMENT;  Surgeon: Vanna Scotland, MD;  Location: [facility];  Service: Urology;  Laterality: Left;   CYSTOSCOPY/URETEROSCOPY/HOLMIUM LASER/STENT PLACEMENT Bilateral 03/20/2022   Procedure: CYSTOSCOPY/URETEROSCOPY/HOLMIUM LASER/STENT PLACEMENT;  Surgeon: Sondra Come, MD;  Location: [facility];  Service: Urology;  Laterality: Bilateral;   EXTRACORPOREAL SHOCK WAVE LITHOTRIPSY Left 11/25/2017   Procedure: EXTRACORPOREAL SHOCK WAVE LITHOTRIPSY (ESWL);  Surgeon: Vanna Scotland, MD;  Location: [facility];  Service: Urology;  Laterality: Left;   Social History:  reports that he has been smoking cigarettes. He has a 0.13 pack-year smoking history. He has never used smokeless tobacco. He reports that he does not drink alcohol and does not use drugs.  No Known Allergies  History reviewed. No pertinent family history.  Prior to Admission medications   Medication Sig Start Date End Date Taking? Authorizing Provider  hydrochlorothiazide (HYDRODIURIL) 25 MG tablet Take 1 tablet (25 mg total) by mouth daily. 06/09/21  Yes Elson Areas, PA-C  MOUNJARO 10 MG/0.5ML Pen Inject 15 mg into the skin once a week. 01/31/22  Yes [provider]  oxyCODONE-acetaminophen (PERCOCET/ROXICET) 5-325  MG tablet Take 2 tablets by mouth every 6 (six) hours as needed. 01/21/15  Yes [provider]  SUMAtriptan (IMITREX) 50 MG tablet Take 1 tablet (50 mg total) by mouth once as needed for migraine. May repeat in 2 hours if headache persists or recurs. 06/09/21 06/10/22  Elson Areas, PA-C  tamsulosin (FLOMAX) 0.4 MG CAPS capsule Take 1 capsule (0.4 mg total) by mouth daily for 14 days. 03/21/22 04/04/22  Lewie Chamber, MD    Physical Exam: Vitals:   03/21/22 0850 03/21/22 0851 03/21/22 0900 03/21/22 1041  BP:  96/62 91/60 130/64  Pulse: 61 (!) 58 (!) 47 87  Resp: 17 19 15 16   Temp:    97.9 F (36.6 C)  TempSrc:    Oral  SpO2: 96% 94% 93% 100%  Weight:      Height:       Physical Exam Vitals and nursing note reviewed.  Constitutional:      Appearance: Normal appearance.  HENT:     Head: Normocephalic and atraumatic.     Nose: Nose normal.     Mouth/Throat:     Mouth: Mucous membranes are moist.  Eyes:     Conjunctiva/sclera: Conjunctivae normal.  Cardiovascular:     Rate and Rhythm: Normal rate and regular rhythm.  Pulmonary:     Effort: Pulmonary effort is normal.     Breath sounds: Normal breath sounds.  Abdominal:     General: Abdomen is flat. Bowel sounds are normal.     Palpations: Abdomen is soft.     Comments: No CVA tenderness  Musculoskeletal:        General: Normal range of motion.     Cervical back: Normal range of motion and neck supple.  Skin:    General: Skin is warm and dry.  Neurological:     General: No focal deficit present.     Mental Status: He is alert and oriented to person, place, and time.  Psychiatric:        Mood and Affect: Mood normal.        Behavior: Behavior normal.     Data Reviewed: Relevant notes from primary care and specialist visits, past discharge summaries as available in EHR, including Care Everywhere. Prior diagnostic testing as pertinent to current admission diagnoses Updated medications and problem lists for  reconciliation ED course, including vitals, labs, imaging, treatment and response to treatment Triage notes, nursing and pharmacy notes and ED provider's notes Notable results as noted in HPI Labs reviewed.  Total protein 6.6, albumin 3.4, AST 22, ALT 27, alkaline phosphatase 67, total bilirubin 0.7, lipase 35, sodium 138, potassium 3.8, chloride 108, bicarb 23, glucose 127, BUN 42, creatinine 2.75, calcium 8.7, white count 22.6, hemoglobin 14.2, hematocrit 40.8, platelet count 298 There are no new results to review at this time.  Assessment and Plan: * Obstructive uropathy Patient with a history of nephrolithiasis status post cystoscopy with lithotripsy and bilateral stent placement who presents for evaluation of worsening right flank pain. Renal stone CT was suggestive of possible stent malfunction with worsening hydronephrosis We will request urology consult  Syncope, vasovagal Patient had 2 syncopal episodes which may be vasovagal and secondary to hypotension as well as severe pain Hold all antihypertensive medications for now Orthostatic blood pressure checks Patient on cardiac monitor to rule out arrhythmia Obtain 2D echocardiogram to  assess LVEF Continue IV fluid hydration  AKI (acute kidney injury) (HCC) Patient has a baseline serum creatinine of 1.2 today on admission it is 2.75 AKI initially thought to be secondary to obstructive uropathy and patient is status post bilateral stent placement Continue to hold hydrochlorothiazide Continue IV fluid hydration Pete renal parameters in a.m.  Nodule of right lung Patient noted to have an incidental finding of a 12 mm noncalcified right lower lobe lung nodule. Outpatient dedicated chest CT for further evaluation and referral to pulmonary if needed  HTN (hypertension), benign Hold hydrochlorothiazide for now due to AKI and relative hypotension  Obesity (BMI 30-39.9) BMI 36.3 Complicates overall prognosis and care  UTI (urinary  tract infection) Patient with pyuria and marked leukocytosis status post recent instrumentation We will place patient empirically on Rocephin 1 g IV daily Follow-up results of urine culture  Type 2 diabetes mellitus without complication, with long-term current use of insulin (HCC) Patient is on weekly Mounjaro Check blood sugars with meals Maintain system carbohydrate diet      Advance Care Planning:   Code Status: Full Code   Consults: Urology  Family Communication: Greater than 50% of time was spent discussing patient's condition and plan of care with him at the bedside.  All questions and concerns have been addressed.  He verbalizes understanding and agrees with the plan.  Severity of Illness: The appropriate patient status for this patient is OBSERVATION. Observation status is judged to be reasonable and necessary in order to provide the required intensity of service to ensure the patient's safety. The patient's presenting symptoms, physical exam findings, and initial radiographic and laboratory data in the context of their medical condition is felt to place them at decreased risk for further clinical deterioration. Furthermore, it is anticipated that the patient will be medically stable for discharge from the hospital within 2 midnights of admission.   Author: Lucile Shutters, MD 03/21/2022 1:28 PM  For on call review [email or web address].

## 2022-03-21 NOTE — Assessment & Plan Note (Addendum)
BMI 36.3 Complicates overall prognosis and care

## 2022-03-21 NOTE — Progress Notes (Signed)
  Echocardiogram 2D Echocardiogram has been performed.  Sean Rice 03/21/2022, 3:15 PM

## 2022-03-21 NOTE — Assessment & Plan Note (Signed)
Patient noted to have an incidental finding of a 12 mm noncalcified right lower lobe lung nodule. Outpatient dedicated chest CT for further evaluation and referral to pulmonary if needed

## 2022-03-21 NOTE — ED Provider Notes (Addendum)
[facility] Provider Note    Event Date/Time   First MD Initiated Contact with Patient 03/21/22 0532     (approximate)   History   Flank Pain (/)   HPI  Sean Rice is a 61 y.o. male   Past medical history of recurrent kidney stones, hypertension, presents to the emergency department with right-sided flank pain.  Of note he was admitted to the hospital a few days ago with renal colic and bilateral kidney stones which required lithotripsy and stent placements by urology and was discharged in good condition without pain yesterday evening but subsequently when he got home he developed right-sided flank pain excruciating.  There was an instance where he passed out due to pain.  No significant injuries from the syncopal event.  He has had some discomfort with urination since his procedure, denies urgency or frequency.  He has not urinated tonight.    History was obtained via the patient and review of external medical notes including urology note from 03/20/2022 for lithotripsy and stent placements.      Physical Exam   Triage Vital Signs: ED Triage Vitals  Enc Vitals Group     BP 03/21/22 0531 130/70     Pulse Rate 03/21/22 0531 66     Resp 03/21/22 0531 17     Temp 03/21/22 0531 97.7 F (36.5 C)     Temp Source 03/21/22 0531 Oral     SpO2 03/21/22 0531 98 %     Weight 03/21/22 0529 283 lb (128.4 kg)     Height 03/21/22 0529 6\' 2"  (1.88 m)     Head Circumference --      Peak Flow --      Pain Score 03/21/22 0529 10     Pain Loc --      Pain Edu? --      Excl. in GC? --     Most recent vital signs: Vitals:   03/21/22 0531 03/21/22 0630  BP: 130/70 (!) 109/54  Pulse: 66 68  Resp: 17 16  Temp: 97.7 F (36.5 C)   SpO2: 98% 97%    General: Awake, no distress.  CV:  Good peripheral perfusion. Resp:  Normal effort.  Abd:  No distention.  Other:  Right CVA tenderness, mild diffuse abdominal tenderness without rigidity or guarding.   ED  Results / Procedures / Treatments   Labs (all labs ordered are listed, but only abnormal results are displayed) Labs Reviewed  CBC - Abnormal; Notable for the following components:      Result Value   WBC 22.6 (*)    All other components within normal limits  BASIC METABOLIC PANEL - Abnormal; Notable for the following components:   Glucose, Bld 127 (*)    BUN 42 (*)    Creatinine, Ser 2.75 (*)    Calcium 8.7 (*)    GFR, Estimated 26 (*)    All other components within normal limits  URINE CULTURE  URINALYSIS, ROUTINE W REFLEX MICROSCOPIC  HEPATIC FUNCTION PANEL  LIPASE, BLOOD     I reviewed labs and they are notable for AKI Cr 2.75 from 2.72 and WBC 22.6 from 14  RADIOLOGY I independently reviewed and interpreted CT abd/pelvis and see R hydronephrosis    PROCEDURES:  Critical Care performed: No  Procedures   MEDICATIONS ORDERED IN ED: Medications  morphine (PF) 4 MG/ML injection 4 mg (4 mg Intravenous Given 03/21/22 0555)  ondansetron (ZOFRAN) injection 4 mg (4 mg Intravenous Given  03/21/22 0554)  sodium chloride 0.9 % bolus 1,000 mL (1,000 mLs Intravenous Bolus 03/21/22 0710)    Consultants:  I spoke with urology regarding care plan for this patient.   IMPRESSION / MDM / ASSESSMENT AND PLAN / ED COURSE  I reviewed the triage vital signs and the nursing notes.                              Differential diagnosis includes, but is not limited to, stent migration or obstruction, renal stone, pyelonephritis, acute urinary retention, intra-abdominal infection    MDM: This is a patient with recurrent kidney stones status post lithotripsy and stent placement yesterday with now worsening right flank pain.  We will obtain a CT scan without contrast given recent AKI, check for problems with the stent, recurrent kidney stones, obstructive uropathy and labs, urinalysis.  Pain control with IV morphine and IV antiemetic Zofran.  R sided hydronephrosis worsened from prior  concern for stent malfunction; UA pending; urology paged 7:32 AM  Spoke with urologist on-call Dr. Ronne Binning who reviewed the CT scan, likely stent malfunction but will come see the patient.  Admit to hospitalist for pain control.  Urology consulting.  Patient's presentation is most consistent with acute presentation with potential threat to life or bodily function.       FINAL CLINICAL IMPRESSION(S) / ED DIAGNOSES   Final diagnoses:  Right flank pain     Rx / DC Orders   ED Discharge Orders     None        Note:  This document was prepared using Dragon voice recognition software and may include unintentional dictation errors.    Pilar Jarvis, MD 03/21/22 4970    Pilar Jarvis, MD 03/21/22 [phone]

## 2022-03-21 NOTE — Assessment & Plan Note (Signed)
Hold hydrochlorothiazide for now due to AKI and relative hypotension

## 2022-03-21 NOTE — Assessment & Plan Note (Signed)
Patient had 2 syncopal episodes which may be vasovagal and secondary to hypotension as well as severe pain Hold all antihypertensive medications for now Orthostatic blood pressure checks Patient on cardiac monitor to rule out arrhythmia Obtain 2D echocardiogram to assess LVEF Continue IV fluid hydration

## 2022-03-22 LAB — ECHOCARDIOGRAM COMPLETE
AR max vel: 2.89 cm2
AV Peak grad: 13.1 mmHg
Ao pk vel: 1.81 m/s
Area-P 1/2: 3.89 cm2
Calc EF: 68.7 %
Height: 74 in
S' Lateral: 3.5 cm
Single Plane A2C EF: 75.7 %
Single Plane A4C EF: 60.3 %
Weight: 4528 oz

## 2022-03-22 LAB — URINE CULTURE: Culture: NO GROWTH

## 2022-03-22 NOTE — Progress Notes (Signed)
       CROSS COVER NOTE  NAME: Sean Rice MRN: [id number] DOB : [birth year]    Time of Service   0400  HPI/Events of Note   Informed by nursing staff patient AMA due to not being allowed to go outside and smoke despite offering of nicotine patch and education   Donnie Mesa NP [facility]

## 2022-03-22 NOTE — Progress Notes (Signed)
Patient came to nurse's station stating that he is leaving AMA because he is a grown a** man and can leave the unit whenever he gets ready to smoke. He stated that he is leaving and going home. He stated that if security had've been called because he left the unit, it would not have ended well for people here. He stated that he was not mad at me but at our policy. Came out and spoke with charge nurse Raynelle Fanning before leaving in an aggressive manner. He stated to charge nurse again that he was a grown a** man and that he would've dropped off his equipment to go smoke. He stated that people should not be treated that way. Charge nurse informed him that it was the policy. Patient stated that he had been out to smoke 19 times before anyone knew about it. Charge nurse informed patient that he wasn't supposed to and patient stated "I am just that good at sneaking out". I informed NP Manuela Schwartz and removed IV. Got him to sign AMA form and removed tele. Patient was alert and oriented upon leaving the unit. Walked off unit with girlfriend.

## 2022-03-22 NOTE — Progress Notes (Signed)
Patient left AMA after disagreeing with this Charge RN and his primary RN explaining to him Hospital policy regarding not leaving the unit with IV in place along with a Telemetry unit. Especially after hours when our doors are locked for the protection of our patients and their family members along with fellow employees. Patient did not inform anyone he was leaving the unit to go outside to smoke, and Security was almost called until patient's girlfriend was able to call him and have him return without calling Security. Patient was re-educated regarding not smoking and offered nicotine products, but he refused and left AMA.

## 2022-03-22 NOTE — Progress Notes (Signed)
Patient is alert and oriented x4. Ambulates independently. Has girlfriend at bedside that is sleep in bed with him. He complained of pain and received a dose of dilaudid and oxy so far throughout the night. Was notified by another nurse that patient's heart rate was in 120s. Went to check on patient but he was not in room. Patient was out smoking. Had girlfriend who was in room to call patient back to his room before we would have to call security. Patient has been educated that he cannot leave the floor to smoke or for anything unrelated to medical while under doctor's care. Was offered a nicotine patch but he had refused. Patient came back onto floor and I educated him once again about leaving the floor. Heart rate went back to baseline. Denied needs.

## 2022-03-26 ENCOUNTER — Encounter: Payer: Self-pay | Admitting: Urology

## 2022-03-26 ENCOUNTER — Ambulatory Visit (INDEPENDENT_AMBULATORY_CARE_PROVIDER_SITE_OTHER): Payer: 59 | Admitting: Urology

## 2022-03-26 VITALS — Ht 74.0 in | Wt 283.0 lb

## 2022-03-26 DIAGNOSIS — N200 Calculus of kidney: Secondary | ICD-10-CM

## 2022-03-26 DIAGNOSIS — Z466 Encounter for fitting and adjustment of urinary device: Secondary | ICD-10-CM

## 2022-03-26 LAB — MICROSCOPIC EXAMINATION: RBC, Urine: >30 /hpf — AB (ref 0–2)

## 2022-03-26 LAB — URINALYSIS, COMPLETE
Bilirubin, UA: NEGATIVE
Glucose, UA: NEGATIVE
Ketones, UA: NEGATIVE
Nitrite, UA: NEGATIVE
Specific Gravity, UA: 1.025 (ref 1.005–1.030)
Urobilinogen, Ur: 0.2 mg/dL (ref 0.2–1.0)
pH, UA: 5.5 (ref 5.0–7.5)

## 2022-03-26 MED ORDER — SULFAMETHOXAZOLE-TRIMETHOPRIM 800-160 MG PO TABS
1.0000 | ORAL_TABLET | Freq: Once | ORAL | Status: AC
  Administered 2022-03-26: 1 via ORAL

## 2022-03-26 NOTE — Progress Notes (Addendum)
Cystoscopy Procedure Note:  Indication: Stent removal s/p 03/20/2022 bilateral ureteroscopy, laser lithotripsy, stent placement (7 mm left distal ureteral stone, 1 cm right renal pelvis stone).   Was seen in ER with right-sided flank pain on 03/21/2022, on my review the CT actually looks pretty benign with stents in appropriate position, no significant residual stone fragments, urine culture negative.   Bactrim given for prophylaxis  After informed consent and discussion of the procedure and its risks, Sean Rice was positioned and prepped in the standard fashion. Cystoscopy was performed with a flexible cystoscope. The stent was grasped with flexible graspers and removed in its entirety.  The scope was reinserted and the contralateral stent was removed.  The patient tolerated the procedure well.  Findings: Uncomplicated stent removal x2  Assessment and Plan: Continue Flomax x1 week Follow-up stone analysis results, consider repeat 24-hour urine in the future RTC 6 months KUB prior  Sondra Come, MD 03/26/2022

## 2022-03-26 NOTE — Patient Instructions (Signed)

## 2022-03-26 NOTE — Addendum Note (Signed)
Addended by: Sueanne Margarita on: 03/26/2022 12:17 PM   Modules accepted: Orders

## 2022-03-26 NOTE — Addendum Note (Signed)
Addended by: Sueanne Margarita on: 03/26/2022 12:08 PM   Modules accepted: Orders

## 2022-03-28 LAB — CALCULI, WITH PHOTOGRAPH (CLINICAL LAB)
Calcium Oxalate Monohydrate: 10 %
Uric Acid Calculi: 90 %
Weight Calculi: 3 mg

## 2022-03-30 ENCOUNTER — Other Ambulatory Visit: Payer: Self-pay

## 2022-03-30 ENCOUNTER — Telehealth: Payer: Self-pay

## 2022-03-30 DIAGNOSIS — N200 Calculus of kidney: Secondary | ICD-10-CM

## 2022-03-30 MED ORDER — POTASSIUM CHLORIDE ER 15 MEQ PO TBCR: 15.0000 meq | EXTENDED_RELEASE_TABLET | Freq: Two times a day (BID) | ORAL | 11 refills | Status: DC

## 2022-03-30 NOTE — Telephone Encounter (Signed)
-----   Message from Sondra Come, MD sent at 03/30/2022  7:24 AM EST ----- Larina Bras analysis showed uric acid stones, these can be prevented with potassium citrate and would strongly recommend Potassium citrate BID and repeat 24 hour urine test in 6 mo  Legrand Rams, MD 03/30/2022

## 2022-03-30 NOTE — Telephone Encounter (Signed)
Called pt no answer. Unable to leave message as voicemail is full. Mychart sent. RX sent.

## 2022-04-08 NOTE — Discharge Summary (Signed)
Physician Discharge Summary   Patient: Sean Rice MRN: [id number] DOB: Oct 25, 1960  Admit date:     03/21/2022  Discharge date: 03/22/2022  Discharge Physician: Jeffrey Graefe   PCP: Mick SellFitzgerald, David P, MD   Recommendations at discharge:    Patient signed out AMA  Discharge Diagnoses: Principal Problem:   Obstructive uropathy Active Problems:   Syncope, vasovagal   AKI (acute kidney injury) (HCC)   Nodule of right lung   HTN (hypertension), benign   Obesity (BMI 30-39.9)   Type 2 diabetes mellitus without complication, with long-term current use of insulin (HCC)   UTI (urinary tract infection)  Resolved Problems:   * No resolved hospital problems. *  Hospital Course:  Sean Rice is a 61 y.o. Rice with medical history significant for hypertension, nicotine dependence and nephrolithiasis status post cystoscopy/left retrograde pyelogram/right retrograde pyelogram/left ureteroscopy, laser lithotripsy, stent placement/right ureteroscopy, laser lithotripsy, stent placement for acute kidney injury secondary to obstructive uropathy on 03/20/22.  Patient was discharged home following the procedure in stable condition and returns to the emergency room in the early hours of the morning for evaluation of severe right flank pain. Patient states that he developed excruciating right-sided flank pain which he rated a 10 x 10 in intensity at its worst.  He had 2 syncopal episodes per patient which he attributed to the severity of the pain.  He has dysuria and hematuria but thinks it is from the recent procedure that he had.  He denies having any fever or chills.  He had nausea and vomiting prior to presenting to the ER but all that has resolved following administration of a dose of IV morphine. During my evaluation patient had systolic blood pressure in the 80s and complained of feeling dizzy and lightheaded. He received 2 L of IV fluids in the ER. He denied having any chest pain, no shortness of  breath, no headache, no cough, no changes in his bowel habits, no leg swelling, no headache, no blurred vision, no focal deficit. He had a CT scan of abdomen and pelvis which showed new bilateral double-J ureteral stents since yesterday. Positioning appears appropriate and left hydronephrosis has regressed. But right hydronephrosis appears increased. Query stent malfunction on that side. The 17 mm right renal pelvic calculus is no longer identified, with smaller stones or stone fragments now in the dependent right collecting system.  Labs show a white count of 22.6, pyuria, serum creatinine of 2.75 compared to 2.72 on 03/20/22. He received a dose of IV Rocephin and morphine 4 mg in the ER and was referred to observation status. Patient signed out AMA        Assessment and Plan: * Obstructive uropathy Patient with a history of nephrolithiasis status post cystoscopy with lithotripsy and bilateral stent placement who presents for evaluation of worsening right flank pain. Renal stone CT was suggestive of possible stent malfunction with worsening hydronephrosis We will request urology consult  Syncope, vasovagal Patient had 2 syncopal episodes which may be vasovagal and secondary to hypotension as well as severe pain Hold all antihypertensive medications for now Orthostatic blood pressure checks Patient on cardiac monitor to rule out arrhythmia Obtain 2D echocardiogram to assess LVEF Continue IV fluid hydration  AKI (acute kidney injury) (HCC) Patient has a baseline serum creatinine of 1.2 today on admission it is 2.75 AKI initially thought to be secondary to obstructive uropathy and patient is status post bilateral stent placement Continue to hold hydrochlorothiazide Continue IV fluid hydration Pete renal parameters in  a.m.  Nodule of right lung Patient noted to have an incidental finding of a 12 mm noncalcified right lower lobe lung nodule. Outpatient dedicated chest CT for further  evaluation and referral to pulmonary if needed  HTN (hypertension), benign Hold hydrochlorothiazide for now due to AKI and relative hypotension  Obesity (BMI 30-39.9) BMI 36.3 Complicates overall prognosis and care  UTI (urinary tract infection) Patient with pyuria and marked leukocytosis status post recent instrumentation We will place patient empirically on Rocephin 1 g IV daily Follow-up results of urine culture  Type 2 diabetes mellitus without complication, with long-term current use of insulin (HCC) Patient is on weekly Mounjaro Check blood sugars with meals Maintain system carbohydrate diet        Pain control - [place] Controlled Substance Reporting System database was reviewed. and patient was instructed, not to drive, operate heavy machinery, perform activities at heights, swimming or participation in water activities or provide baby-sitting services while on Pain, Sleep and Anxiety Medications; until their outpatient Physician has advised to do so again. Also recommended to not to take more than prescribed Pain, Sleep and Anxiety Medications.  Consultants: Urology  Procedures performed: None Disposition:  AMA Diet recommendation: Consistent carb Cardiac and Carb modified diet DISCHARGE MEDICATION: Allergies as of 03/22/2022   No Known Allergies      Medication List     ASK your doctor about these medications    hydrochlorothiazide 25 MG tablet Commonly known as: HYDRODIURIL Take 1 tablet (25 mg total) by mouth daily.   Mounjaro 10 MG/0.5ML Pen Generic drug: tirzepatide Inject 15 mg into the skin once a week.   oxyCODONE-acetaminophen 5-325 MG tablet Commonly known as: PERCOCET/ROXICET Take 2 tablets by mouth every 6 (six) hours as needed.   SUMAtriptan 50 MG tablet Commonly known as: Imitrex Take 1 tablet (50 mg total) by mouth once as needed for migraine. May repeat in 2 hours if headache persists or recurs.   tamsulosin 0.4 MG Caps  capsule Commonly known as: FLOMAX Take 1 capsule (0.4 mg total) by mouth daily for 14 days. Ask about: Should I take this medication?        Discharge Exam: Filed Weights   03/21/22 0529  Weight: 128.4 kg     Condition at discharge:  AMA  The results of significant diagnostics from this hospitalization (including imaging, microbiology, ancillary and laboratory) are listed below for reference.   Imaging Studies: ECHOCARDIOGRAM COMPLETE  Result Date: 03/22/2022    ECHOCARDIOGRAM REPORT   Patient Name:   Sean Rice Date of Exam: 03/21/2022 Medical Rec #:  [id number]  Height:       74.0 in Accession #:    [id number] Weight:       283.0 lb Date of Birth:  1960/09/11 BSA:          2.519 m Patient Age:    61 years   BP:           130/64 mmHg Patient Gender: M          HR:           70 bpm. Exam Location:  [facility] Procedure: 2D Echo Indications:     Syncope R55  History:         Patient has no prior history of Echocardiogram examinations.  Sonographer:     Overton Mam RDCS Referring Phys:  [id number] Caylynn Minchew Diagnosing Phys: Marcina Millard MD  Sonographer Comments: Technically difficult study due to poor echo windows. IMPRESSIONS  1.  Left ventricular ejection fraction, by estimation, is 60 to 65%. The left ventricle has normal function. The left ventricle has no regional wall motion abnormalities. Left ventricular diastolic parameters were normal.  2. Right ventricular systolic function is normal. The right ventricular size is normal.  3. The mitral valve is normal in structure. Trivial mitral valve regurgitation. No evidence of mitral stenosis.  4. The aortic valve is normal in structure. Aortic valve regurgitation is not visualized. No aortic stenosis is present.  5. The inferior vena cava is normal in size with greater than 50% respiratory variability, suggesting right atrial pressure of 3 mmHg. FINDINGS  Left Ventricle: Left ventricular ejection fraction, by estimation, is 60 to 65%.  The left ventricle has normal function. The left ventricle has no regional wall motion abnormalities. The left ventricular internal cavity size was normal in size. There is  no left ventricular hypertrophy. Left ventricular diastolic parameters were normal. Right Ventricle: The right ventricular size is normal. No increase in right ventricular wall thickness. Right ventricular systolic function is normal. Left Atrium: Left atrial size was normal in size. Right Atrium: Right atrial size was normal in size. Pericardium: There is no evidence of pericardial effusion. Mitral Valve: The mitral valve is normal in structure. Trivial mitral valve regurgitation. No evidence of mitral valve stenosis. Tricuspid Valve: The tricuspid valve is normal in structure. Tricuspid valve regurgitation is mild . No evidence of tricuspid stenosis. Aortic Valve: The aortic valve is normal in structure. Aortic valve regurgitation is not visualized. No aortic stenosis is present. Aortic valve peak gradient measures 13.1 mmHg. Pulmonic Valve: The pulmonic valve was normal in structure. Pulmonic valve regurgitation is not visualized. No evidence of pulmonic stenosis. Aorta: The aortic root is normal in size and structure. Venous: The inferior vena cava is normal in size with greater than 50% respiratory variability, suggesting right atrial pressure of 3 mmHg. IAS/Shunts: No atrial level shunt detected by color flow Doppler.  LEFT VENTRICLE PLAX 2D LVIDd:         5.40 cm     Diastology LVIDs:         3.50 cm     LV e' medial:    11.90 cm/s LV PW:         1.00 cm     LV E/e' medial:  7.8 LV IVS:        1.10 cm     LV e' lateral:   11.70 cm/s LVOT diam:     2.10 cm     LV E/e' lateral: 8.0 LV SV:         89 LV SV Index:   35 LVOT Area:     3.46 cm  LV Volumes (MOD) LV vol d, MOD A2C: 89.3 ml LV vol d, MOD A4C: 87.9 ml LV vol s, MOD A2C: 21.7 ml LV vol s, MOD A4C: 34.9 ml LV SV MOD A2C:     67.6 ml LV SV MOD A4C:     87.9 ml LV SV MOD BP:      63.4  ml RIGHT VENTRICLE RV Basal diam:  3.30 cm TAPSE (M-mode): 3.1 cm LEFT ATRIUM           Index       RIGHT ATRIUM           Index LA diam:      3.50 cm 1.39 cm/m  RA Area:     10.30 cm LA Vol (A2C): 18.2 ml 7.23 ml/m  RA Volume:  22.10 ml  8.77 ml/m LA Vol (A4C): 19.8 ml 7.86 ml/m  AORTIC VALVE                 PULMONIC VALVE AV Area (Vmax): 2.89 cm     PV Vmax:       1.17 m/s AV Vmax:        181.00 cm/s  PV Peak grad:  5.5 mmHg AV Peak Grad:   13.1 mmHg LVOT Vmax:      151.00 cm/s LVOT Vmean:     86.200 cm/s LVOT VTI:       0.256 m  AORTA Ao Root diam: 3.40 cm Ao Asc diam:  3.00 cm MITRAL VALVE               TRICUSPID VALVE MV Area (PHT): 3.89 cm    TR Peak grad:   32.3 mmHg MV Decel Time: 195 msec    TR Vmax:        284.00 cm/s MV E velocity: 93.30 cm/s MV A velocity: 72.30 cm/s  SHUNTS MV E/A ratio:  1.29        Systemic VTI:  0.26 m                            Systemic Diam: 2.10 cm Marcina Millard MD Electronically signed by Marcina Millard MD Signature Date/Time: 03/22/2022/9:28:06 AM    Final    CT Renal Stone Study  Result Date: 03/21/2022 CLINICAL DATA:  61 year old Rice with flank pain and kidney stones now status post ureteral stents. Syncope. EXAM: CT ABDOMEN AND PELVIS WITHOUT CONTRAST TECHNIQUE: Multidetector CT imaging of the abdomen and pelvis was performed following the standard protocol without IV contrast. RADIATION DOSE REDUCTION: This exam was performed according to the departmental dose-optimization program which includes automated exposure control, adjustment of the mA and/or kV according to patient size and/or use of iterative reconstruction technique. COMPARISON:  Noncontrast CT Abdomen and Pelvis yesterday. FINDINGS: Lower chest: Stable aside from increased mild atelectasis. 12 mm right costophrenic angle lung nodule, referr to recommendation on the CT yesterday. Hepatobiliary: Stable hepatic steatosis. Partially contracted gallbladder. Pancreas: Stable and negative  noncontrast appearance. Spleen: Stable and negative. Adrenals/Urinary Tract: Adrenal glands remain normal. New bilateral double-J ureteral stents with small volume of gas in the bladder and collecting systems. Left-side stent appears appropriately positioned. Left-side hydronephrosis has regressed. Mild left pararenal and periureteral inflammation persists. Right side double-J stent positioning is satisfactory. Proximal pigtail is at the UPJ. However, mild to moderate right hydronephrosis appears increased since yesterday although the large central collecting system 17 mm calculus is no longer identified. There are smaller stones or stone fragments now in the dependent right renal collecting system. Right pararenal and periureteral inflammation has mildly increased. Otherwise stable right kidney. Bladder is decompressed. Numerous pelvic phleboliths. Stomach/Bowel: Stable, negative. No pneumoperitoneum. No free fluid. Vascular/Lymphatic: Normal caliber abdominal aorta. Aortoiliac calcified atherosclerosis. No lymphadenopathy identified. Reproductive: Negative. Other: No pelvic free fluid. Musculoskeletal: Stable.  No acute osseous abnormality identified. IMPRESSION: 1. New bilateral double-J ureteral stents since yesterday. Positioning appears appropriate and left hydronephrosis has regressed. But right hydronephrosis appears increased. Query stent malfunction on that side. The 17 mm right renal pelvic calculus is no longer identified, with smaller stones or stone fragments now in the dependent right collecting system. 2. Otherwise stable non-contrast abdomen or pelvis, as detailed yesterday (please also see that report for findings and recommendations) Electronically Signed   By: Rexene Edison  Margo Aye M.D.   On: 03/21/2022 06:28   DG OR UROLOGY CYSTO IMAGE ([facility] ONLY)  Result Date: 03/20/2022 There is no interpretation for this exam.  This order is for images obtained during a surgical procedure.  Please See "Surgeries" Tab  for more information regarding the procedure.   CT Renal Stone Study  Result Date: 03/20/2022 CLINICAL DATA:  Left flank pain. EXAM: CT ABDOMEN AND PELVIS WITHOUT CONTRAST TECHNIQUE: Multidetector CT imaging of the abdomen and pelvis was performed following the standard protocol without IV contrast. RADIATION DOSE REDUCTION: This exam was performed according to the departmental dose-optimization program which includes automated exposure control, adjustment of the mA and/or kV according to patient size and/or use of iterative reconstruction technique. COMPARISON:  Oct 07, 2020 FINDINGS: Lower chest: A 12 mm noncalcified lung nodule is seen within the posteromedial aspect of the right lung base (measured 10 mm on the prior exam). Hepatobiliary: No focal liver abnormality is seen. No gallstones, gallbladder wall thickening, or biliary dilatation. Pancreas: Unremarkable. No pancreatic ductal dilatation or surrounding inflammatory changes. Spleen: Normal in size without focal abnormality. Adrenals/Urinary Tract: Adrenal glands are unremarkable. Kidneys are normal in size. Stable bilateral renal cysts are noted. An 18 mm renal calculus is seen within the right renal pelvis. A 7 mm renal calculus is seen at the left UVJ, with mild left-sided hydronephrosis, hydroureter and perinephric inflammatory fat stranding. An additional 5 mm renal calculus is seen within the urinary bladder on the left, near the left UVJ. This is present on the prior study. Bladder is unremarkable. Stomach/Bowel: Stomach is within normal limits. Appendix appears normal. No evidence of bowel wall thickening, distention, or inflammatory changes. Vascular/Lymphatic: Aortic atherosclerosis. No enlarged abdominal or pelvic lymph nodes. Reproductive: Prostate is unremarkable. Other: No abdominal wall hernia or abnormality. No abdominopelvic ascites. Musculoskeletal: No acute or significant osseous findings. IMPRESSION: 1. 7 mm left UVJ renal calculus  with mild left-sided hydronephrosis, hydroureter and perinephric inflammatory fat stranding. 2. 18 mm renal calculus within the right renal pelvis. 3. Stable bilateral renal cysts. No follow-up imaging is recommended. This recommendation follows ACR consensus guidelines: Management of the Incidental Renal Mass on CT: A White Paper of the ACR Incidental Findings Committee. J Am Coll Radiol 2018;15:264-273. 4. 12 mm noncalcified lung nodule within the posteromedial aspect of the right lung base (measured 10 mm on the prior exam). Further evaluation with complete nonemergent chest CT is recommended. 5. Aortic atherosclerosis. Aortic Atherosclerosis (ICD10-I70.0). Electronically Signed   By: Aram Candela M.D.   On: 03/20/2022 01:09    Microbiology: Results for orders placed or performed during the hospital encounter of 03/21/22  Urine Culture     Status: None   Collection Time: 03/21/22  5:36 AM   Specimen: Urine, Clean Catch  Result Value Ref Range Status   Specimen Description   Final    URINE, CLEAN CATCH Performed at [facility], [address] [place], [place] [address]    Special Requests   Final    NONE Performed at [facility], [address], [place], [place] [address]    Culture   Final    NO GROWTH Performed at [facility], [address] [place], [place] [address]    Report Status 03/22/2022 FINAL  Final    Labs: CBC: No results for input(s): "WBC", "NEUTROABS", "HGB", "HCT", "MCV", "PLT" in the last 168 hours. Basic Metabolic Panel: No results for input(s): "NA", "K", "CL", "CO2", "GLUCOSE", "BUN", "CREATININE", "CALCIUM", "MG", "  PHOS" in the last 168 hours. Liver Function Tests: No results for input(s): "AST", "ALT", "ALKPHOS", "BILITOT", "PROT", "ALBUMIN" in the last 168 hours. CBG: No results for input(s): "GLUCAP" in the last 168 hours.  Discharge time spent: less than 30 minutes.  Signed: Lucile Shutters, MD [facility] 04/08/2022

## 2022-09-24 ENCOUNTER — Ambulatory Visit: Payer: 59 | Admitting: Urology

## 2022-10-29 ENCOUNTER — Other Ambulatory Visit: Payer: Self-pay

## 2022-10-29 ENCOUNTER — Ambulatory Visit: Payer: 59 | Admitting: Urology

## 2022-10-29 DIAGNOSIS — N200 Calculus of kidney: Secondary | ICD-10-CM

## 2022-11-20 IMAGING — DX DG CHEST 2V
2 series · 2 of 2 positions shown · non-contrast
Comparison: None.

CLINICAL DATA: Shortness of breath for the past 2 months.

EXAM:
CHEST - 2 VIEW

[chest pa]
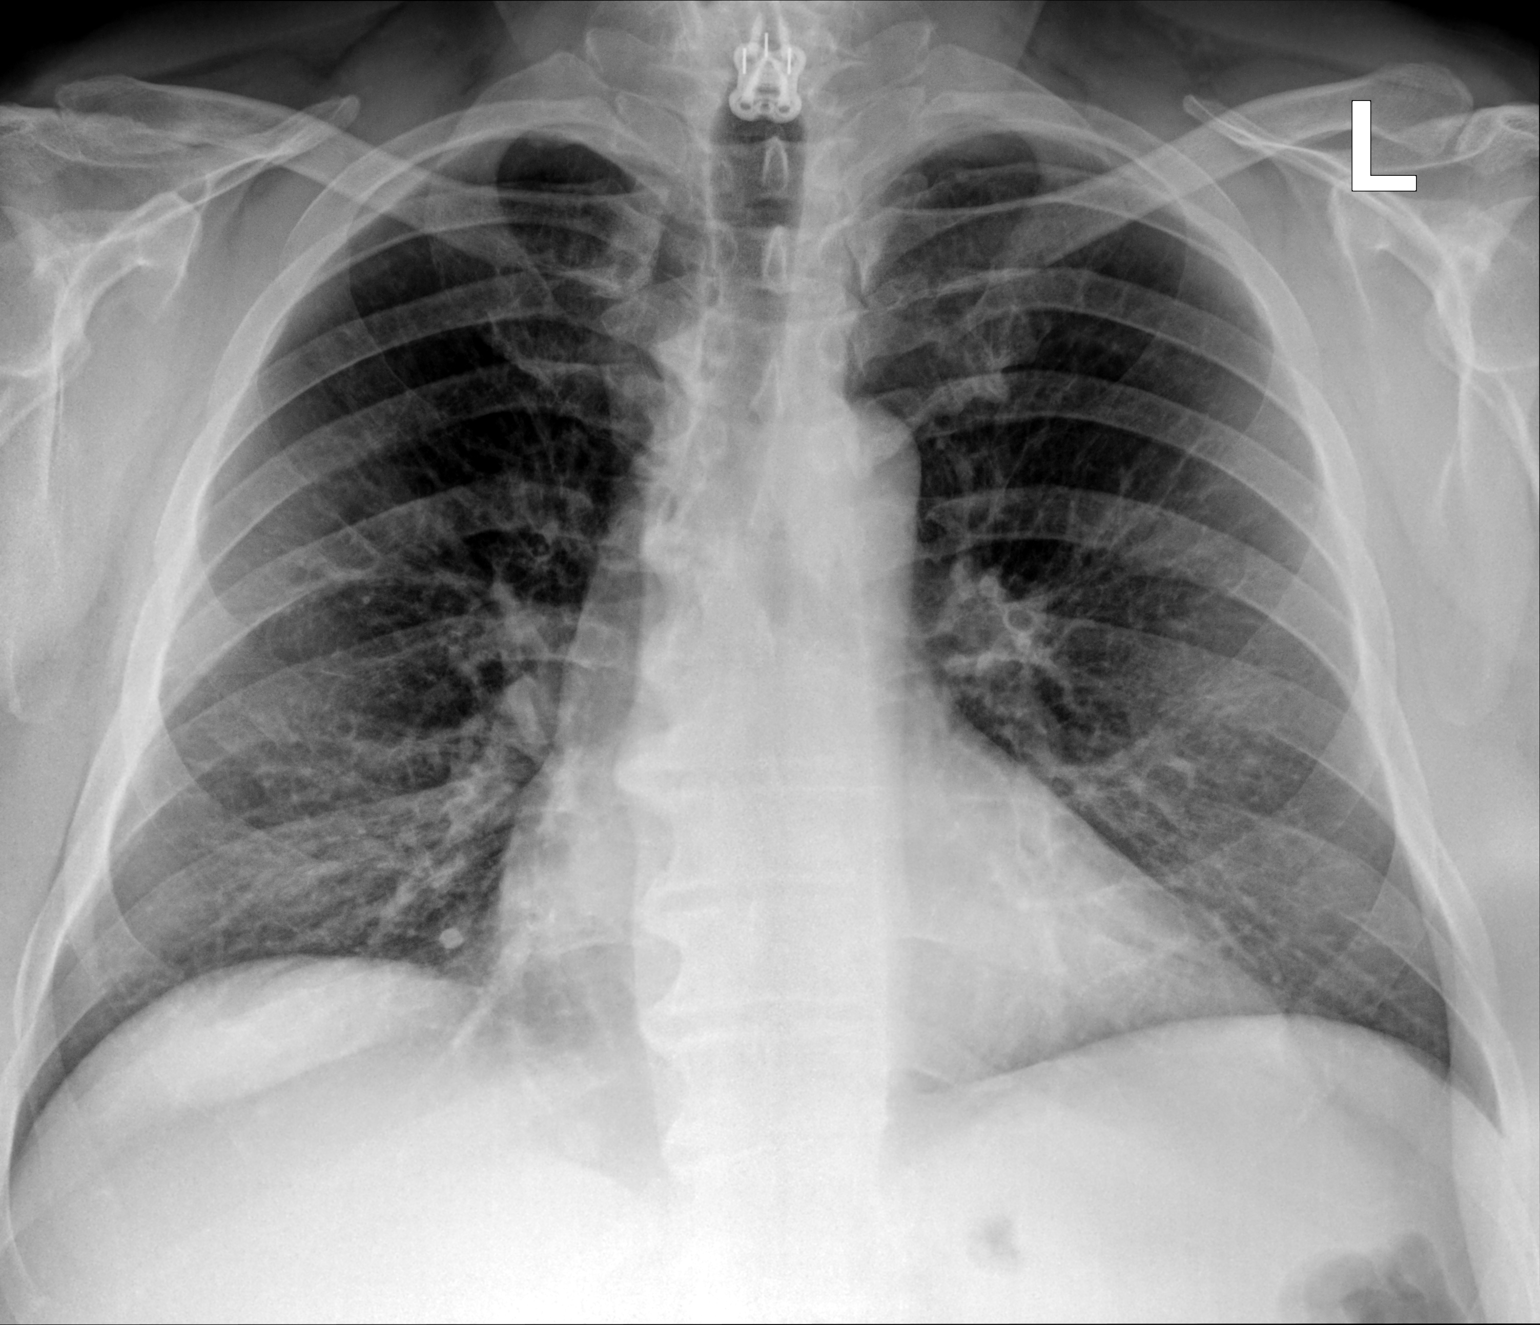

[chest lat]
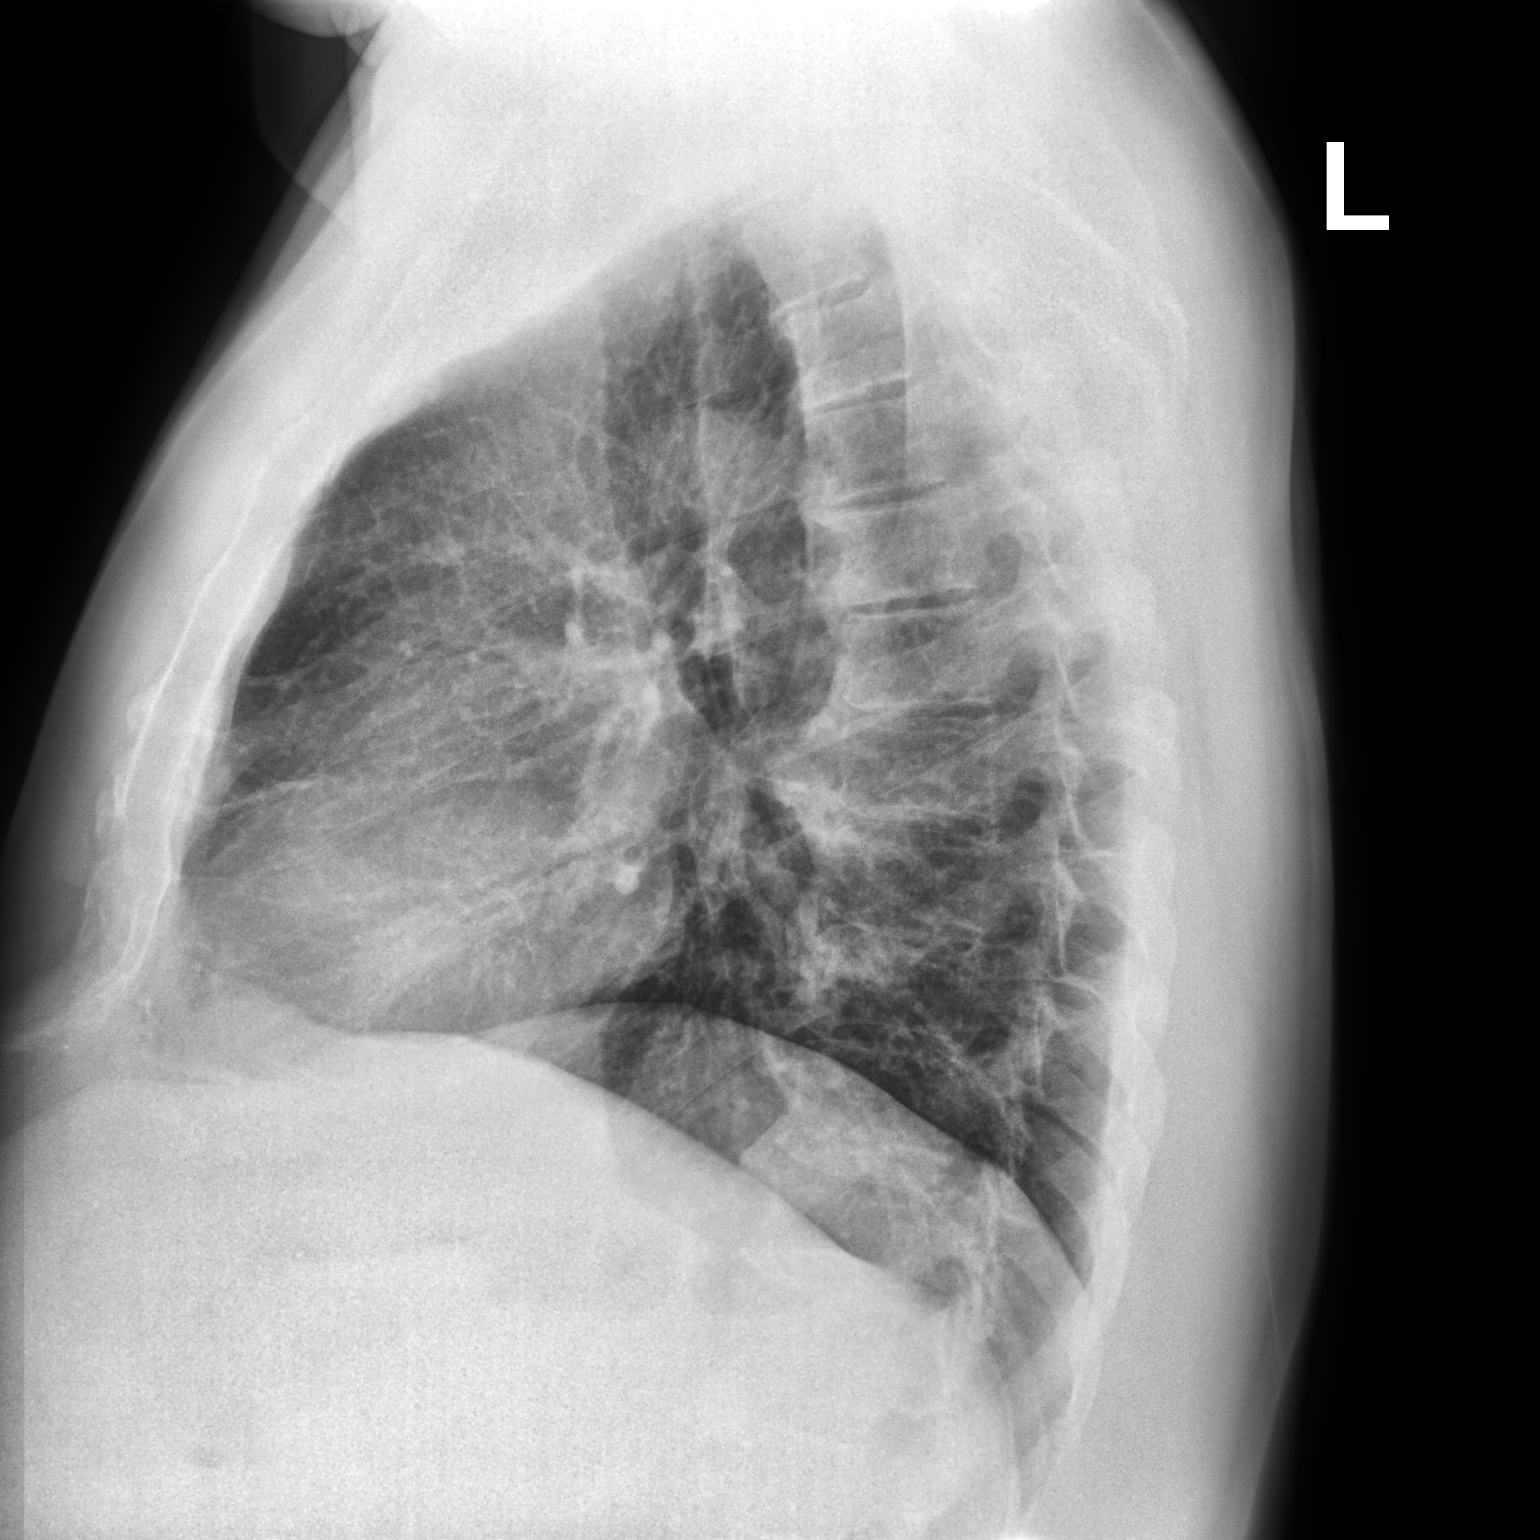

[2 of 2 positions shown; findings below may reference images not displayed]

FINDINGS: The heart size and mediastinal contours are within normal limits.
Both lungs are clear. The visualized skeletal structures are
unremarkable.
IMPRESSION: No active cardiopulmonary disease.

## 2023-01-20 ENCOUNTER — Ambulatory Visit (INDEPENDENT_AMBULATORY_CARE_PROVIDER_SITE_OTHER): Payer: 59 | Admitting: Urology

## 2023-01-20 ENCOUNTER — Encounter: Payer: Self-pay | Admitting: Urology

## 2023-01-20 VITALS — BP 113/76 | HR 69 | Ht 74.0 in | Wt 256.6 lb

## 2023-01-20 DIAGNOSIS — N200 Calculus of kidney: Secondary | ICD-10-CM | POA: Diagnosis not present

## 2023-01-20 DIAGNOSIS — L291 Pruritus scroti: Secondary | ICD-10-CM

## 2023-01-20 DIAGNOSIS — Z125 Encounter for screening for malignant neoplasm of prostate: Secondary | ICD-10-CM

## 2023-01-20 MED ORDER — BETAMETHASONE DIPROPIONATE 0.05 % EX CREA: TOPICAL_CREAM | Freq: Two times a day (BID) | CUTANEOUS | 0 refills | Status: AC

## 2023-01-20 MED ORDER — POTASSIUM CITRATE ER 15 MEQ (1620 MG) PO TBCR: 1.0000 | EXTENDED_RELEASE_TABLET | Freq: Two times a day (BID) | ORAL | 11 refills | Status: AC

## 2023-01-20 NOTE — Progress Notes (Signed)
   01/20/2023 10:07 AM   Sean Rice 07/03/1960 [id number]  Reason for visit: Follow up nephrolithiasis, scrotal lesions, urinary symptoms, PSA screening  HPI: 62 year old male who underwent bilateral ureteroscopy in November 2023, stones were uric acid.  I had recommended potassium citrate but this was never started.  He reports he has passed a few small stones since that time.  He also reports some spraying stream and weaker stream over the last few months.  He also has some chronic scrotal itching that is been going on for at least a few years, as well as what sounds like recurrent sebaceous cysts of the scrotum that he drains at home.  Recent PSA from February 2024 normal at 0.7, reassurance provided.  On exam, there is some thickening of the scrotum but no evidence of abscess or infection, may be a sebaceous cyst on the left side deeper, no testicular abnormalities.  The meatus was prepped with Betadine, and a 14 French silicone Foley passed easily into the urethra with no evidence of fossa navicularis stricture.  We discussed general stone prevention strategies including adequate hydration with goal of producing 2.5 L of urine daily, increasing citric acid intake, increasing calcium intake during high oxalate meals, minimizing animal protein, and decreasing salt intake. Information about dietary recommendations given today.  With his uric acid stones I also recommended starting potassium citrate, and he was agreeable.  Regarding the scrotal itching, I think it is reasonable to try a short course of a topical steroid cream, it sounds like he has tried numerous over-the-counter creams and powders without any significant improvement.  We also discussed the sebaceous cyst could be managed with surgical excision, and he like to hold off at this time.  Potassium citrate 15 mEq twice daily for uric acid stone prevention Trial of topical steroid cream for chronic scrotal itching RTC 1 year KUB,  sooner if problems   Sondra Come, MD  [facility] [address], [address] [place], [place] [address] [phone]

## 2023-01-20 NOTE — Patient Instructions (Signed)
Epidermoid Cyst(Sebaceous cyst)  An epidermoid cyst, also known as epidermal cyst, is a sac made of skin tissue. The sac contains a substance called keratin. Keratin is a protein that is normally secreted through the hair follicles. When keratin becomes trapped in the top layer of skin (epidermis), it can form an epidermoid cyst. Epidermoid cysts can be found anywhere on your body. These cysts are usually harmless (benign), and they may not cause symptoms unless they become inflamed or infected. What are the causes? This condition may be caused by: A blocked hair follicle. A hair that curls and re-enters the skin instead of growing straight out of the skin (ingrown hair). A blocked pore. Irritated skin. An injury to the skin. Certain conditions that are passed along from parent to child (inherited). Human papillomavirus (HPV). This happens rarely when cysts occur on the bottom of the feet. Long-term (chronic) sun damage to the skin. What increases the risk? The following factors may make you more likely to develop an epidermoid cyst: Having acne. Being male. Having an injury to the skin. Being past puberty. Having certain rare genetic disorders. What are the signs or symptoms? The only symptom of this condition may be a small, painless lump underneath the skin. When an epidermal cyst ruptures, it may become inflamed. True infection in cysts is rare. Symptoms may include: Redness. Inflammation. Tenderness. Warmth. Keratin draining from the cyst. Keratin is grayish-white, bad-smelling substance. Pus draining from the cyst. How is this diagnosed? This condition is diagnosed with a physical exam. In some cases, you may have a sample of tissue (biopsy) taken from your cyst to be examined under a microscope or tested for bacteria. You may be referred to a health care provider who specializes in skin care (dermatologist). How is this treated? If a cyst becomes inflamed, treatment may  include: Opening and draining the cyst, done by a health care provider. After draining, minor surgery to remove the rest of the cyst may be done. Taking antibiotic medicine. Having injections of medicines (steroids) that help to reduce inflammation. Having surgery to remove the cyst. Surgery may be done if the cyst: Becomes large. Bothers you. Has a chance of turning into cancer. Do not try to open a cyst yourself. Follow these instructions at home: Medicines If you were prescribed an antibiotic medicine, take it it as told by your health care provider. Do not stop using the antibiotic even if you start to feel better. Take over-the-counter and prescription medicines only as told by your health care provider. General instructions Keep the area around your cyst clean and dry. Wear loose, dry clothing. Avoid touching your cyst. Check your cyst every day for signs of infection. Check for: Redness, swelling, or pain. Fluid or blood. Warmth. Pus or a bad smell. Keep all follow-up visits. This is important. How is this prevented? Wear clean, dry, clothing. Avoid wearing tight clothing. Keep your skin clean and dry. Take showers or baths every day. Contact a health care provider if: Your cyst develops symptoms of infection. Your condition is not improving or is getting worse. You develop a cyst that looks different from other cysts you have had. You have a fever. Get help right away if: Redness spreads from the cyst into the surrounding area. Summary An epidermoid cyst is a sac made of skin tissue. These cysts are usually harmless (benign), and they may not cause symptoms unless they become inflamed. If a cyst becomes inflamed, treatment may include surgery to open and drain  the cyst, or to remove it. Treatment may also include medicines by mouth or through an injection. Take over-the-counter and prescription medicines only as told by your health care provider. If you were prescribed an  antibiotic medicine, take it as told by your health care provider. Do not stop using the antibiotic even if you start to feel better. Contact a health care provider if your condition is not improving or is getting worse. Keep all follow-up visits as told by your health care provider. This is important. This information is not intended to replace advice given to you by your health care provider. Make sure you discuss any questions you have with your health care provider. Document Revised: 08/01/2019 Document Reviewed: 08/02/2019 [employer] Patient Education  2024 [employer].

## 2024-01-20 ENCOUNTER — Other Ambulatory Visit: Payer: Self-pay

## 2024-01-20 DIAGNOSIS — N200 Calculus of kidney: Secondary | ICD-10-CM

## 2024-01-25 ENCOUNTER — Ambulatory Visit: Payer: Self-pay | Admitting: Urology
